# Patient Record
Sex: Female | Born: 1940 | Race: Black or African American | Hispanic: No | Smoking: Never smoker
Health system: Southern US, Community
[De-identification: ages and names within clinical notes are randomized; demographics above are authoritative.]

## PROBLEM LIST (undated history)

## (undated) DIAGNOSIS — Z923 Personal history of irradiation: Secondary | ICD-10-CM

## (undated) DIAGNOSIS — E079 Disorder of thyroid, unspecified: Secondary | ICD-10-CM

## (undated) DIAGNOSIS — E785 Hyperlipidemia, unspecified: Secondary | ICD-10-CM

## (undated) DIAGNOSIS — I10 Essential (primary) hypertension: Secondary | ICD-10-CM

## (undated) DIAGNOSIS — E119 Type 2 diabetes mellitus without complications: Secondary | ICD-10-CM

## (undated) HISTORY — DX: Essential (primary) hypertension: I10

## (undated) HISTORY — PX: OTHER SURGICAL HISTORY: SHX169

---

## 1997-11-18 ENCOUNTER — Ambulatory Visit (HOSPITAL_COMMUNITY): Admission: RE | Admit: 1997-11-18 | Discharge: 1997-11-18 | Payer: Self-pay | Admitting: Family Medicine

## 1997-12-20 ENCOUNTER — Other Ambulatory Visit: Admission: RE | Admit: 1997-12-20 | Discharge: 1997-12-20 | Payer: Self-pay | Admitting: Family Medicine

## 1998-05-18 ENCOUNTER — Emergency Department (HOSPITAL_COMMUNITY): Admission: EM | Admit: 1998-05-18 | Discharge: 1998-05-18 | Payer: Self-pay | Admitting: Endocrinology

## 1998-05-18 ENCOUNTER — Encounter: Payer: Self-pay | Admitting: Endocrinology

## 1998-09-04 ENCOUNTER — Emergency Department (HOSPITAL_COMMUNITY): Admission: EM | Admit: 1998-09-04 | Discharge: 1998-09-04 | Payer: Self-pay | Admitting: Emergency Medicine

## 1998-09-05 ENCOUNTER — Encounter: Payer: Self-pay | Admitting: Emergency Medicine

## 1998-12-23 ENCOUNTER — Other Ambulatory Visit: Admission: RE | Admit: 1998-12-23 | Discharge: 1998-12-23 | Payer: Self-pay | Admitting: Family Medicine

## 1999-04-19 ENCOUNTER — Emergency Department (HOSPITAL_COMMUNITY): Admission: EM | Admit: 1999-04-19 | Discharge: 1999-04-19 | Payer: Self-pay

## 1999-04-24 ENCOUNTER — Encounter: Payer: Self-pay | Admitting: Family Medicine

## 1999-04-24 ENCOUNTER — Encounter: Admission: RE | Admit: 1999-04-24 | Discharge: 1999-04-24 | Payer: Self-pay | Admitting: Family Medicine

## 1999-05-16 ENCOUNTER — Emergency Department (HOSPITAL_COMMUNITY): Admission: EM | Admit: 1999-05-16 | Discharge: 1999-05-16 | Payer: Self-pay | Admitting: Emergency Medicine

## 1999-05-16 ENCOUNTER — Encounter: Payer: Self-pay | Admitting: Emergency Medicine

## 1999-05-21 ENCOUNTER — Encounter: Payer: Self-pay | Admitting: Family Medicine

## 1999-05-21 ENCOUNTER — Encounter: Admission: RE | Admit: 1999-05-21 | Discharge: 1999-05-21 | Payer: Self-pay | Admitting: Family Medicine

## 1999-12-10 ENCOUNTER — Encounter: Payer: Self-pay | Admitting: Family Medicine

## 1999-12-10 ENCOUNTER — Encounter: Admission: RE | Admit: 1999-12-10 | Discharge: 1999-12-10 | Payer: Self-pay | Admitting: Family Medicine

## 1999-12-29 ENCOUNTER — Other Ambulatory Visit: Admission: RE | Admit: 1999-12-29 | Discharge: 1999-12-29 | Payer: Self-pay | Admitting: Family Medicine

## 2000-04-27 ENCOUNTER — Encounter: Payer: Self-pay | Admitting: Family Medicine

## 2000-04-27 ENCOUNTER — Encounter: Admission: RE | Admit: 2000-04-27 | Discharge: 2000-04-27 | Payer: Self-pay | Admitting: Family Medicine

## 2000-12-14 ENCOUNTER — Encounter: Payer: Self-pay | Admitting: Family Medicine

## 2000-12-14 ENCOUNTER — Encounter: Admission: RE | Admit: 2000-12-14 | Discharge: 2000-12-14 | Payer: Self-pay | Admitting: Family Medicine

## 2000-12-29 ENCOUNTER — Other Ambulatory Visit: Admission: RE | Admit: 2000-12-29 | Discharge: 2000-12-29 | Payer: Self-pay | Admitting: Family Medicine

## 2001-03-09 ENCOUNTER — Encounter: Payer: Self-pay | Admitting: Emergency Medicine

## 2001-03-09 ENCOUNTER — Emergency Department (HOSPITAL_COMMUNITY): Admission: EM | Admit: 2001-03-09 | Discharge: 2001-03-09 | Payer: Self-pay | Admitting: Emergency Medicine

## 2001-03-12 ENCOUNTER — Emergency Department (HOSPITAL_COMMUNITY): Admission: EM | Admit: 2001-03-12 | Discharge: 2001-03-12 | Payer: Self-pay | Admitting: Emergency Medicine

## 2001-03-12 ENCOUNTER — Encounter: Payer: Self-pay | Admitting: Emergency Medicine

## 2001-05-15 ENCOUNTER — Encounter: Admission: RE | Admit: 2001-05-15 | Discharge: 2001-05-15 | Payer: Self-pay | Admitting: Family Medicine

## 2001-05-15 ENCOUNTER — Encounter: Payer: Self-pay | Admitting: Family Medicine

## 2001-12-18 ENCOUNTER — Encounter: Admission: RE | Admit: 2001-12-18 | Discharge: 2001-12-18 | Payer: Self-pay | Admitting: Family Medicine

## 2001-12-18 ENCOUNTER — Encounter: Payer: Self-pay | Admitting: Family Medicine

## 2002-01-02 ENCOUNTER — Other Ambulatory Visit: Admission: RE | Admit: 2002-01-02 | Discharge: 2002-01-02 | Payer: Self-pay | Admitting: Family Medicine

## 2002-05-16 ENCOUNTER — Encounter: Admission: RE | Admit: 2002-05-16 | Discharge: 2002-05-16 | Payer: Self-pay | Admitting: Family Medicine

## 2002-05-16 ENCOUNTER — Encounter: Payer: Self-pay | Admitting: Family Medicine

## 2002-12-26 ENCOUNTER — Encounter: Payer: Self-pay | Admitting: Family Medicine

## 2002-12-26 ENCOUNTER — Encounter: Admission: RE | Admit: 2002-12-26 | Discharge: 2002-12-26 | Payer: Self-pay | Admitting: Family Medicine

## 2003-01-04 ENCOUNTER — Other Ambulatory Visit: Admission: RE | Admit: 2003-01-04 | Discharge: 2003-01-04 | Payer: Self-pay | Admitting: Family Medicine

## 2003-05-20 ENCOUNTER — Encounter: Admission: RE | Admit: 2003-05-20 | Discharge: 2003-05-20 | Payer: Self-pay | Admitting: Family Medicine

## 2004-01-03 ENCOUNTER — Encounter: Admission: RE | Admit: 2004-01-03 | Discharge: 2004-01-03 | Payer: Self-pay | Admitting: Family Medicine

## 2004-01-08 ENCOUNTER — Other Ambulatory Visit: Admission: RE | Admit: 2004-01-08 | Discharge: 2004-01-08 | Payer: Self-pay | Admitting: Family Medicine

## 2004-05-21 ENCOUNTER — Ambulatory Visit (HOSPITAL_COMMUNITY): Admission: RE | Admit: 2004-05-21 | Discharge: 2004-05-21 | Payer: Self-pay | Admitting: Family Medicine

## 2005-01-14 ENCOUNTER — Encounter: Admission: RE | Admit: 2005-01-14 | Discharge: 2005-01-14 | Payer: Self-pay | Admitting: Family Medicine

## 2005-01-19 ENCOUNTER — Other Ambulatory Visit: Admission: RE | Admit: 2005-01-19 | Discharge: 2005-01-19 | Payer: Self-pay | Admitting: Family Medicine

## 2005-06-08 ENCOUNTER — Encounter: Admission: RE | Admit: 2005-06-08 | Discharge: 2005-06-08 | Payer: Self-pay | Admitting: Family Medicine

## 2006-01-18 ENCOUNTER — Encounter: Admission: RE | Admit: 2006-01-18 | Discharge: 2006-01-18 | Payer: Self-pay | Admitting: Family Medicine

## 2006-01-20 ENCOUNTER — Other Ambulatory Visit: Admission: RE | Admit: 2006-01-20 | Discharge: 2006-01-20 | Payer: Self-pay | Admitting: Family Medicine

## 2006-06-09 ENCOUNTER — Encounter: Admission: RE | Admit: 2006-06-09 | Discharge: 2006-06-09 | Payer: Self-pay | Admitting: Family Medicine

## 2007-01-20 ENCOUNTER — Encounter: Admission: RE | Admit: 2007-01-20 | Discharge: 2007-01-20 | Payer: Self-pay | Admitting: Family Medicine

## 2007-06-21 ENCOUNTER — Encounter: Admission: RE | Admit: 2007-06-21 | Discharge: 2007-06-21 | Payer: Self-pay | Admitting: Family Medicine

## 2008-01-19 ENCOUNTER — Encounter: Admission: RE | Admit: 2008-01-19 | Discharge: 2008-01-19 | Payer: Self-pay | Admitting: Family Medicine

## 2008-01-24 ENCOUNTER — Other Ambulatory Visit: Admission: RE | Admit: 2008-01-24 | Discharge: 2008-01-24 | Payer: Self-pay | Admitting: Family Medicine

## 2008-06-21 ENCOUNTER — Encounter: Admission: RE | Admit: 2008-06-21 | Discharge: 2008-06-21 | Payer: Self-pay | Admitting: Internal Medicine

## 2009-06-23 ENCOUNTER — Encounter: Admission: RE | Admit: 2009-06-23 | Discharge: 2009-06-23 | Payer: Self-pay | Admitting: Family Medicine

## 2009-06-30 ENCOUNTER — Encounter: Admission: RE | Admit: 2009-06-30 | Discharge: 2009-06-30 | Payer: Self-pay | Admitting: *Deleted

## 2010-05-24 ENCOUNTER — Encounter: Payer: Self-pay | Admitting: Family Medicine

## 2010-05-28 ENCOUNTER — Other Ambulatory Visit: Payer: Self-pay | Admitting: Family Medicine

## 2010-05-28 DIAGNOSIS — Z1239 Encounter for other screening for malignant neoplasm of breast: Secondary | ICD-10-CM

## 2010-06-24 ENCOUNTER — Ambulatory Visit
Admission: RE | Admit: 2010-06-24 | Discharge: 2010-06-24 | Disposition: A | Discharge status: Home / Self Care | Payer: 59 | Source: Ambulatory Visit | Attending: Family Medicine | Admitting: Family Medicine

## 2010-06-24 DIAGNOSIS — Z1239 Encounter for other screening for malignant neoplasm of breast: Secondary | ICD-10-CM

## 2011-05-17 ENCOUNTER — Other Ambulatory Visit: Payer: Self-pay | Admitting: Family Medicine

## 2011-05-17 DIAGNOSIS — Z1231 Encounter for screening mammogram for malignant neoplasm of breast: Secondary | ICD-10-CM

## 2011-06-28 ENCOUNTER — Ambulatory Visit
Admission: RE | Admit: 2011-06-28 | Discharge: 2011-06-28 | Disposition: A | Discharge status: Home / Self Care | Payer: Medicare Other | Source: Ambulatory Visit | Attending: Family Medicine | Admitting: Family Medicine

## 2011-06-28 DIAGNOSIS — Z1231 Encounter for screening mammogram for malignant neoplasm of breast: Secondary | ICD-10-CM

## 2011-10-08 ENCOUNTER — Telehealth: Payer: Self-pay | Admitting: *Deleted

## 2011-10-08 NOTE — Telephone Encounter (Signed)
Left message for pt to return my call so I can schedule a genetics appt. 

## 2012-05-24 ENCOUNTER — Other Ambulatory Visit: Payer: Self-pay | Admitting: Family Medicine

## 2012-05-24 DIAGNOSIS — Z1231 Encounter for screening mammogram for malignant neoplasm of breast: Secondary | ICD-10-CM

## 2012-05-24 DIAGNOSIS — M8580 Other specified disorders of bone density and structure, unspecified site: Secondary | ICD-10-CM

## 2012-06-28 ENCOUNTER — Ambulatory Visit
Admission: RE | Admit: 2012-06-28 | Discharge: 2012-06-28 | Disposition: A | Discharge status: Home / Self Care | Payer: Medicare Other | Source: Ambulatory Visit | Attending: Family Medicine | Admitting: Family Medicine

## 2012-06-28 DIAGNOSIS — Z1231 Encounter for screening mammogram for malignant neoplasm of breast: Secondary | ICD-10-CM

## 2012-07-21 ENCOUNTER — Other Ambulatory Visit: Payer: Medicare Other

## 2012-07-21 ENCOUNTER — Ambulatory Visit: Payer: Medicare Other

## 2013-03-17 ENCOUNTER — Emergency Department (HOSPITAL_COMMUNITY)
Admission: EM | Admit: 2013-03-17 | Discharge: 2013-03-17 | Disposition: A | Discharge status: Home / Self Care | Payer: Medicare Other | Attending: Emergency Medicine | Admitting: Emergency Medicine

## 2013-03-17 ENCOUNTER — Emergency Department (HOSPITAL_COMMUNITY): Payer: Medicare Other

## 2013-03-17 ENCOUNTER — Encounter (HOSPITAL_COMMUNITY): Payer: Self-pay | Admitting: Emergency Medicine

## 2013-03-17 DIAGNOSIS — E119 Type 2 diabetes mellitus without complications: Secondary | ICD-10-CM | POA: Insufficient documentation

## 2013-03-17 DIAGNOSIS — Z79899 Other long term (current) drug therapy: Secondary | ICD-10-CM | POA: Insufficient documentation

## 2013-03-17 DIAGNOSIS — E079 Disorder of thyroid, unspecified: Secondary | ICD-10-CM | POA: Insufficient documentation

## 2013-03-17 DIAGNOSIS — J029 Acute pharyngitis, unspecified: Secondary | ICD-10-CM

## 2013-03-17 DIAGNOSIS — E785 Hyperlipidemia, unspecified: Secondary | ICD-10-CM | POA: Insufficient documentation

## 2013-03-17 HISTORY — DX: Type 2 diabetes mellitus without complications: E11.9

## 2013-03-17 HISTORY — DX: Hyperlipidemia, unspecified: E78.5

## 2013-03-17 HISTORY — DX: Disorder of thyroid, unspecified: E07.9

## 2013-03-17 MED ORDER — LIDOCAINE VISCOUS 2 % MT SOLN
15.0000 mL | Freq: Once | OROMUCOSAL | Status: AC
  Administered 2013-03-17: 15 mL via OROMUCOSAL
  Filled 2013-03-17: qty 15

## 2013-03-17 MED ORDER — ALUM & MAG HYDROXIDE-SIMETH 200-200-20 MG/5ML PO SUSP
30.0000 mL | Freq: Once | ORAL | Status: AC
  Administered 2013-03-17: 30 mL via ORAL
  Filled 2013-03-17: qty 30

## 2013-03-17 NOTE — ED Notes (Signed)
Pt transported to Xray. 

## 2013-03-17 NOTE — ED Notes (Addendum)
Pt states she was eating sausage last night and food went down the wrong way. Pt wants to be evaluated to make sure food is not still dislodged. Denies choking or anyway breathing difficulty. Pt voice is hoarse.

## 2013-03-17 NOTE — ED Notes (Signed)
PA gave water to pt, pt was able to swallow but spit after she swallowed the water. No gagging, she is able to maintain secretions, and airway intact.

## 2013-03-17 NOTE — ED Provider Notes (Signed)
CSN: [id number]     Arrival date & time 03/17/13  4098 History   First MD Initiated Contact with Patient 03/17/13 0636     Chief Complaint  Patient presents with  . Dysphagia   (Consider location/radiation/quality/duration/timing/severity/associated sxs/prior Treatment) Patient is a 72 y.o. female presenting with pharyngitis. The history is provided by the patient. No language interpreter was used.  Sore Throat This is a new problem. The current episode started today. Associated symptoms include a sore throat. Pertinent negatives include no abdominal pain, chest pain, chills, congestion, coughing, fever, nausea, rash, vomiting or weakness.   Pt is a 72 year old female who presents with c/o sore throat after eating chicken and a lot of hot sauce last night. She reports that she feels like her food "went down the wrong way" leaving her throat irritated. She denies fever, chills, nausea, vomiting or diarrhea. She reports that she slept fine last night. She reports that she has not had any recent illness lately and has been taking care of her elderly mother. She denies recent sick exposure or travel.   Past Medical History  Diagnosis Date  . Diabetes mellitus without complication   . Thyroid disease   . Hyperlipemia    History reviewed. No pertinent past surgical history. History reviewed. No pertinent family history. History  Substance Use Topics  . Smoking status: Never Smoker   . Smokeless tobacco: Not on file  . Alcohol Use: No   OB History   Grav Para Term Preterm Abortions TAB SAB Ect Mult Living                 Review of Systems  Constitutional: Negative for fever and chills.  HENT: Positive for sore throat. Negative for congestion.   Respiratory: Negative for cough, choking and shortness of breath.   Cardiovascular: Negative for chest pain.  Gastrointestinal: Negative for nausea, vomiting, abdominal pain, diarrhea and constipation.  Skin: Negative for rash.   Neurological: Negative for weakness.  All other systems reviewed and are negative.    Allergies  Review of patient's allergies indicates no known allergies.  Home Medications   Current Outpatient Rx  Name  Route  Sig  Dispense  Refill  . levothyroxine (SYNTHROID, LEVOTHROID) 50 MCG tablet   Oral   Take 50 mcg by mouth daily.         . simvastatin (ZOCOR) 20 MG tablet   Oral   Take 20 mg by mouth daily.          BP 146/68  Pulse 67  Temp(Src) 98.7 F (37.1 C) (Oral)  Resp 18  SpO2 100% Physical Exam  Nursing note and vitals reviewed. Constitutional: She is oriented to person, place, and time. She appears well-developed and well-nourished. No distress.  HENT:  Head: Normocephalic and atraumatic.  Mouth/Throat: Posterior oropharyngeal erythema present.  Neck: Normal range of motion. Neck supple. No JVD present. No tracheal deviation present. No thyromegaly present.  Cardiovascular: Normal rate, regular rhythm and normal heart sounds.   Pulmonary/Chest: Effort normal and breath sounds normal. No respiratory distress. She has no wheezes.  Abdominal: Soft. Bowel sounds are normal. She exhibits no distension. There is no tenderness.  Musculoskeletal: Normal range of motion.  Lymphadenopathy:    She has no cervical adenopathy.  Neurological: She is alert and oriented to person, place, and time.  Skin: Skin is warm and dry.  Psychiatric: Her behavior is normal. Judgment and thought content normal.    ED Course  Procedures (including  critical care time) Labs Review Labs Reviewed - No data to display Imaging Review No results found.  EKG Interpretation   None       MDM   1. Sore throat    Pt has a sore and irritated throat, and sensation that she swallowed something the wrong way after eating a lot of hot sauce last night. Throat appears mildly erythematous. Swallowing liquids here in ER without any difficulty. No edema or difficulty breathing at any time.  Soft tissue neck negative. Probable irritation from hot sauce.      Irish Elders, NP 03/17/13 0830

## 2013-03-19 NOTE — ED Provider Notes (Signed)
Medical screening examination/treatment/procedure(s) were performed by non-physician practitioner and as supervising physician I was immediately available for consultation/collaboration.  EKG Interpretation   None         Augusten Lipkin M Margreat Widener, MD 03/19/13 1500 

## 2013-04-24 ENCOUNTER — Other Ambulatory Visit: Payer: Self-pay

## 2013-04-24 DIAGNOSIS — Z1231 Encounter for screening mammogram for malignant neoplasm of breast: Secondary | ICD-10-CM

## 2013-05-25 ENCOUNTER — Ambulatory Visit
Admission: RE | Admit: 2013-05-25 | Discharge: 2013-05-25 | Disposition: A | Discharge status: Home / Self Care | Payer: Medicare Other | Source: Ambulatory Visit | Attending: Family Medicine | Admitting: Family Medicine

## 2013-05-25 DIAGNOSIS — M8580 Other specified disorders of bone density and structure, unspecified site: Secondary | ICD-10-CM

## 2013-06-29 ENCOUNTER — Ambulatory Visit: Payer: Medicare Other

## 2013-07-17 ENCOUNTER — Ambulatory Visit
Admission: RE | Admit: 2013-07-17 | Discharge: 2013-07-17 | Disposition: A | Discharge status: Home / Self Care | Payer: Medicare Other | Source: Ambulatory Visit

## 2013-07-17 DIAGNOSIS — Z1231 Encounter for screening mammogram for malignant neoplasm of breast: Secondary | ICD-10-CM

## 2013-10-23 ENCOUNTER — Encounter: Payer: Self-pay | Admitting: Internal Medicine

## 2013-10-24 ENCOUNTER — Encounter: Payer: Self-pay | Admitting: Internal Medicine

## 2013-12-13 ENCOUNTER — Ambulatory Visit (AMBULATORY_SURGERY_CENTER): Payer: Self-pay | Admitting: *Deleted

## 2013-12-13 VITALS — Ht 68.0 in | Wt 180.0 lb

## 2013-12-13 DIAGNOSIS — Z1211 Encounter for screening for malignant neoplasm of colon: Secondary | ICD-10-CM

## 2013-12-13 MED ORDER — MOVIPREP 100 G PO SOLR: 1.0000 | Freq: Once | ORAL | Status: DC

## 2013-12-13 NOTE — Progress Notes (Signed)
Denies allergies to eggs or soy products. Denies complications with sedation or anesthesia. Denies O2 use. Denies use of diet or weight loss medications.  Patient denies access to email or internet.

## 2013-12-25 ENCOUNTER — Telehealth: Payer: Self-pay | Admitting: Internal Medicine

## 2013-12-25 DIAGNOSIS — Z1211 Encounter for screening for malignant neoplasm of colon: Secondary | ICD-10-CM

## 2013-12-25 MED ORDER — MOVIPREP 100 G PO SOLR: 1.0000 | Freq: Once | ORAL | Status: DC

## 2013-12-25 NOTE — Telephone Encounter (Signed)
Pt mixed MoviPrep today and is scheduled for procedure [facility] 8/27.  Rx for MoviPrep sent to pt's pharmacy.  Reviewed prep instructions with pt.

## 2013-12-27 ENCOUNTER — Ambulatory Visit (AMBULATORY_SURGERY_CENTER): Payer: Medicare Other | Admitting: Internal Medicine

## 2013-12-27 ENCOUNTER — Encounter: Payer: Self-pay | Admitting: Internal Medicine

## 2013-12-27 VITALS — BP 131/67 | HR 61 | Temp 96.3°F | Resp 14 | Ht 68.0 in | Wt 180.0 lb

## 2013-12-27 DIAGNOSIS — Z1211 Encounter for screening for malignant neoplasm of colon: Secondary | ICD-10-CM

## 2013-12-27 MED ORDER — SODIUM CHLORIDE 0.9 % IV SOLN: 500.0000 mL | INTRAVENOUS | Status: DC

## 2013-12-27 NOTE — Patient Instructions (Signed)
YOU HAD AN ENDOSCOPIC PROCEDURE TODAY AT THE [facility]: Refer to the procedure report that was given to you for any specific questions about what was found during the examination.  If the procedure report does not answer your questions, please call your gastroenterologist to clarify.  If you requested that your care partner not be given the details of your procedure findings, then the procedure report has been included in a sealed envelope for you to review at your convenience later.  YOU SHOULD EXPECT: Some feelings of bloating in the abdomen. Passage of more gas than usual.  Walking can help get rid of the air that was put into your GI tract during the procedure and reduce the bloating. If you had a lower endoscopy (such as a colonoscopy or flexible sigmoidoscopy) you may notice spotting of blood in your stool or on the toilet paper. If you underwent a bowel prep for your procedure, then you may not have a normal bowel movement for a few days.  DIET: Your first meal following the procedure should be a light meal and then it is ok to progress to your normal diet.  A half-sandwich or bowl of soup is an example of a good first meal.  Heavy or fried foods are harder to digest and may make you feel nauseous or bloated.  Likewise meals heavy in dairy and vegetables can cause extra gas to form and this can also increase the bloating.  Drink plenty of fluids but you should avoid alcoholic beverages for 24 hours.  ACTIVITY: Your care partner should take you home directly after the procedure.  You should plan to take it easy, moving slowly for the rest of the day.  You can resume normal activity the day after the procedure however you should NOT DRIVE or use heavy machinery for 24 hours (because of the sedation medicines used during the test).    SYMPTOMS TO REPORT IMMEDIATELY: A gastroenterologist can be reached at any hour.  During normal business hours, 8:30 AM to 5:00 PM Monday through Friday,  call [phone].  After hours and on weekends, please call the GI answering service at [phone] who will take a message and have the physician on call contact you.   Following lower endoscopy (colonoscopy or flexible sigmoidoscopy):  Excessive amounts of blood in the stool  Significant tenderness or worsening of abdominal pains  Swelling of the abdomen that is new, acute  Fever of 100F or higher    FOLLOW UP: If any biopsies were taken you will be contacted by phone or by letter within the next 1-3 weeks.  Call your gastroenterologist if you have not heard about the biopsies in 3 weeks.  Our staff will call the home number listed on your records the next business day following your procedure to check on you and address any questions or concerns that you may have at that time regarding the information given to you following your procedure. This is a courtesy call and so if there is no answer at the home number and we have not heard from you through the emergency physician on call, we will assume that you have returned to your regular daily activities without incident.  SIGNATURES/CONFIDENTIALITY: You and/or your care partner have signed paperwork which will be entered into your electronic medical record.  These signatures attest to the fact that that the information above on your After Visit Summary has been reviewed and is understood.  Full responsibility of the confidentiality   this discharge information lies with you and/or your care-partner.  Diverticulosis Diverticulosis is the condition that develops when small pouches (diverticula) form in the wall of your colon. Your colon, or large intestine, is where water is absorbed and stool is formed. The pouches form when the inside layer of your colon pushes through weak spots in the outer layers of your colon. CAUSES  No one knows exactly what causes diverticulosis. RISK FACTORS  Being older than 50. Your risk for this condition  increases with age. Diverticulosis is rare in people younger than 40 years. By age 80, almost everyone has it.  Eating a low-fiber diet.  Being frequently constipated.  Being overweight.  Not getting enough exercise.  Smoking.  Taking over-the-counter pain medicines, like aspirin and ibuprofen. SYMPTOMS  Most people with diverticulosis do not have symptoms. DIAGNOSIS  Because diverticulosis often has no symptoms, health care providers often discover the condition during an exam for other colon problems. In many cases, a health care provider will diagnose diverticulosis while using a flexible scope to examine the colon (colonoscopy). TREATMENT  If you have never developed an infection related to diverticulosis, you may not need treatment. If you have had an infection before, treatment may include:  Eating more fruits, vegetables, and grains.  Taking a fiber supplement.  Taking a live bacteria supplement (probiotic).  Taking medicine to relax your colon. HOME CARE INSTRUCTIONS   Drink at least 6-8 glasses of water each day to prevent constipation.  Try not to strain when you have a bowel movement.  Keep all follow-up appointments. If you have had an infection before:  Increase the fiber in your diet as directed by your health care provider or dietitian.  Take a dietary fiber supplement if your health care provider approves.  Only take medicines as directed by your health care provider. SEEK MEDICAL CARE IF:   You have abdominal pain.  You have bloating.  You have cramps.  You have not gone to the bathroom in 3 days. SEEK IMMEDIATE MEDICAL CARE IF:   Your pain gets worse.  Yourbloating becomes very bad.  You have a fever or chills, and your symptoms suddenly get worse.  You begin vomiting.  You have bowel movements that are bloody or black. MAKE SURE YOU:  Understand these instructions.  Will watch your condition.  Will get help right away if you are  not doing well or get worse. Document Released: 01/15/2004 Document Revised: 04/24/2013 Document Reviewed: 03/14/2013 ExitCare Patient Information 2015 [employer]. This information is not intended to replace advice given to you by your health care provider. Make sure you discuss any questions you have with your health care provider.  High fiber diet information given.  

## 2013-12-27 NOTE — Op Note (Signed)
[facility]  [address] [place], [id number]   COLONOSCOPY PROCEDURE REPORT  PATIENT: Sabrina, Sawyer  MR#: [id number] BIRTHDATE: 19-Dec-1940 , 64  yrs. old GENDER: Female ENDOSCOPIST: Eustace Quail, MD REFERRED ID:[id number] Recall, M.D. PROCEDURE DATE:  12/27/2013 PROCEDURE:   Colonoscopy, screening First Screening Colonoscopy - Avg.  risk and is 50 yrs.  old or older - No.  Prior Negative Screening - Now for repeat screening. 10 or more years since last screening  History of Adenoma - Now for follow-up colonoscopy & has been > or = to 3 yrs.  N/A  Polyps Removed Today? No.  Recommend repeat exam, <10 yrs? No. ASA CLASS:   Class II INDICATIONS:average risk screening.   Negative index exam 2005 MEDICATIONS: MAC sedation, administered by CRNA and propofol (Diprivan) 240mg  IV  DESCRIPTION OF PROCEDURE:   After the risks benefits and alternatives of the procedure were thoroughly explained, informed consent was obtained.  A digital rectal exam revealed no abnormalities of the rectum.   The [id number]  endoscope was introduced through the anus and advanced to the cecum, which was identified by both the appendix and ileocecal valve. No adverse events experienced.   The quality of the prep was excellent, using MoviPrep  The instrument was then slowly withdrawn as the colon was fully examined.  COLON FINDINGS: Melanosis was found.   Moderate diverticulosis was noted throughout the entire examined colon.   The colon was otherwise normal.  There was no  inflammation, polyps or cancers unless previously stated.  Retroflexed views revealed internal hemorrhoids. The time to cecum=4 minutes 36 seconds.  Withdrawal time=12 minutes 37 seconds.  The scope was withdrawn and the procedure completed. COMPLICATIONS: There were no complications.  ENDOSCOPIC IMPRESSION: 1.   Melanosis was found 2.   Moderate diverticulosis was noted throughout the entire examined  colon 3.   The colon was otherwise normal  RECOMMENDATIONS: 1. Return to the care of your primary provider.  GI follow up as needed   eSigned:  Eustace Quail, MD 12/27/2013 9:15 AM   cc: The Patient    ; Billey Chang, MD

## 2013-12-27 NOTE — Progress Notes (Signed)
Report to PACU, RN, vss, BBS= Clear.  

## 2013-12-28 ENCOUNTER — Telehealth: Payer: Self-pay | Admitting: *Deleted

## 2013-12-28 NOTE — Telephone Encounter (Signed)
  Follow up Call-  Call back number 12/27/2013  Post procedure Call Back phone  # [phone]  Permission to leave phone message Yes     Patient questions:  Do you have a fever, pain , or abdominal swelling? No. Pain Score  0 *  Have you tolerated food without any problems? Yes.    Have you been able to return to your normal activities? Yes.    Do you have any questions about your discharge instructions: Diet   No. Medications  No. Follow up visit  No.  Do you have questions or concerns about your Care? No.  Actions: * If pain score is 4 or above: No action needed, pain <4.

## 2014-04-19 ENCOUNTER — Ambulatory Visit (INDEPENDENT_AMBULATORY_CARE_PROVIDER_SITE_OTHER): Payer: Medicare Other

## 2014-04-19 ENCOUNTER — Ambulatory Visit (INDEPENDENT_AMBULATORY_CARE_PROVIDER_SITE_OTHER): Payer: Medicare Other | Admitting: Emergency Medicine

## 2014-04-19 VITALS — BP 108/60 | HR 78 | Temp 97.9°F | Resp 18 | Ht 67.0 in | Wt 171.0 lb

## 2014-04-19 DIAGNOSIS — R42 Dizziness and giddiness: Secondary | ICD-10-CM

## 2014-04-19 DIAGNOSIS — R55 Syncope and collapse: Secondary | ICD-10-CM

## 2014-04-19 DIAGNOSIS — R112 Nausea with vomiting, unspecified: Secondary | ICD-10-CM

## 2014-04-19 DIAGNOSIS — M25561 Pain in right knee: Secondary | ICD-10-CM

## 2014-04-19 LAB — POCT CBC
Granulocyte percent: 54.7 %G (ref 37–80)
HCT, POC: 34.9 % — AB (ref 37.7–47.9)
Hemoglobin: 11.1 g/dL — AB (ref 12.2–16.2)
LYMPH, POC: 2.3 (ref 0.6–3.4)
MCH, POC: 26.8 pg — AB (ref 27–31.2)
MCHC: 31.8 g/dL (ref 31.8–35.4)
MCV: 84.1 fL (ref 80–97)
MID (CBC): 0.4 (ref 0–0.9)
MPV: 6.1 fL (ref 0–99.8)
POC Granulocyte: 3.2 (ref 2–6.9)
POC LYMPH %: 38.4 % (ref 10–50)
POC MID %: 6.9 % (ref 0–12)
Platelet Count, POC: 374 10*3/uL (ref 142–424)
RBC: 4.15 M/uL (ref 4.04–5.48)
RDW, POC: 16.3 %
WBC: 5.9 10*3/uL (ref 4.6–10.2)

## 2014-04-19 LAB — GLUCOSE, POCT (MANUAL RESULT ENTRY): POC Glucose: 117 mg/dl — AB (ref 70–99)

## 2014-04-19 NOTE — Progress Notes (Addendum)
Subjective:  This chart was scribed for Sabrina Jordan, MD by Dellis Filbert, ED Scribe at [facility].The patient was seen in exam room 05 and the patient's care was started at 10:42 AM.   Patient ID: Sabrina Sawyer, female    DOB: 1940-10-05, 73 y.o.   MRN: [id number] Chief Complaint  Patient presents with  . Knee Injury    rt injured sat.   . Knee Pain    HPI HPI Comments: Sabrina Sawyer is a 73 y.o. female who presents to [facility] complaining of right knee pain onset 6 days ago. Pt had an episode of syncope while in the bathroom. She first developed nausea then diaphoresis and finally a dizzy spell where she then passed out and hit her right knee on the door. Pt did pass out and is unaware how long she was out and did vomit. She notes having work done on her mouth and did not eat and drink like she was instructed. She also had a dizzy spell on Wednesday, pt believes this maybe because of low blood sugar, she ate candy to remedy this. Pt tried to exercise yesterday and had pain in her right knee. She denies CP, SOB.  Pt has a history of HTN, HLD and hyperthyroidism   There are no active problems to display for this patient.  Past Medical History  Diagnosis Date  . Diabetes mellitus without complication   . Thyroid disease   . Hyperlipemia   . Hypertension    Past Surgical History  Procedure Laterality Date  . Cyst removal face     No Known Allergies Prior to Admission medications   Medication Sig Start Date End Date Taking? Authorizing Provider  levothyroxine (SYNTHROID, LEVOTHROID) 50 MCG tablet Take 50 mcg by mouth daily. 02/13/13  Yes Historical Provider, MD  lisinopril (PRINIVIL,ZESTRIL) 5 MG tablet Take 5 mg by mouth. 10/23/13 10/23/14 Yes Historical Provider, MD  simvastatin (ZOCOR) 20 MG tablet Take 40 mg by mouth daily.  02/13/13  Yes Historical Provider, MD   History   Social History  . Marital Status: Divorced    Spouse Name: N/A    Number of  Children: N/A  . Years of Education: N/A   Occupational History  . Not on file.   Social History Main Topics  . Smoking status: Never Smoker   . Smokeless tobacco: Never Used  . Alcohol Use: No  . Drug Use: No  . Sexual Activity: Not on file   Other Topics Concern  . Not on file   Social History Narrative   Review of Systems  Constitutional: Positive for diaphoresis.  Respiratory: Negative for shortness of breath.   Cardiovascular: Negative for chest pain.  Gastrointestinal: Positive for nausea and vomiting.  Musculoskeletal: Positive for joint swelling.  Neurological: Positive for dizziness.      Objective:  BP 108/60 mmHg  Pulse 78  Temp(Src) 97.9 F (36.6 C) (Oral)  Resp 18  Ht 5\' 7"  (1.702 m)  Wt 171 lb (77.565 kg)  BMI 26.78 kg/m2  SpO2 100%  Physical Exam  Constitutional: She is oriented to person, place, and time. She appears well-developed and well-nourished. No distress.  HENT:  Head: Normocephalic and atraumatic.  Eyes: EOM are normal.  Neck: Normal range of motion.  Cardiovascular: Normal rate, regular rhythm and normal heart sounds.   Pulmonary/Chest: Effort normal and breath sounds normal.  Musculoskeletal:  Mild tenderness just below the patella with no sign swelling. No joint  laxity Negative drawer sign  Neurological: She is alert and oriented to person, place, and time.  Skin: Skin is warm and dry.  Psychiatric: She has a normal mood and affect. Her behavior is normal.  Nursing note and vitals reviewed.     Results for orders placed or performed in visit on 04/19/14  POCT CBC  Result Value Ref Range   WBC 5.9 4.6 - 10.2 K/uL   Lymph, poc 2.3 0.6 - 3.4   POC LYMPH PERCENT 38.4 10 - 50 %L   MID (cbc) 0.4 0 - 0.9   POC MID % 6.9 0 - 12 %M   POC Granulocyte 3.2 2 - 6.9   Granulocyte percent 54.7 37 - 80 %G   RBC 4.15 4.04 - 5.48 M/uL   Hemoglobin 11.1 (A) 12.2 - 16.2 g/dL   HCT, POC 34.9 (A) 37.7 - 47.9 %   MCV 84.1 80 - 97 fL   MCH,  POC 26.8 (A) 27 - 31.2 pg   MCHC 31.8 31.8 - 35.4 g/dL   RDW, POC 16.3 %   Platelet Count, POC 374 142 - 424 K/uL   MPV 6.1 0 - 99.8 fL  POCT glucose (manual entry)  Result Value Ref Range   POC Glucose 117 (A) 70 - 99 mg/dl   [facility] reading (PRIMARY) by  Dr. Everlene Farrier there is soft tissue calcification laterally. 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 There is mild irregularity of the lateral tibial plateau please comment EKG  NSR  Assessment & Plan:  Patient has tri-compartmental arthritic changes. We did advise ice and Tylenol for pain. Her hemoglobin is mildly low at 11.1 and she recently did have GI evaluation by colonoscopy in the summer.I personally performed the services described in this documentation, which was scribed in my presence. The recorded information has been reviewed and is accurate.

## 2014-04-19 NOTE — Patient Instructions (Signed)
Please follow-up with your regular doctor regarding your episode of syncope. Apply ice and take Tylenol for your knee pain.

## 2014-06-21 ENCOUNTER — Other Ambulatory Visit: Payer: Self-pay

## 2014-06-21 DIAGNOSIS — Z1231 Encounter for screening mammogram for malignant neoplasm of breast: Secondary | ICD-10-CM

## 2014-07-19 ENCOUNTER — Ambulatory Visit
Admission: RE | Admit: 2014-07-19 | Discharge: 2014-07-19 | Disposition: A | Discharge status: Home / Self Care | Payer: Medicare Other | Source: Ambulatory Visit

## 2014-07-19 DIAGNOSIS — Z1231 Encounter for screening mammogram for malignant neoplasm of breast: Secondary | ICD-10-CM

## 2015-04-23 ENCOUNTER — Other Ambulatory Visit: Payer: Self-pay | Admitting: Family Medicine

## 2015-04-23 DIAGNOSIS — Z1231 Encounter for screening mammogram for malignant neoplasm of breast: Secondary | ICD-10-CM

## 2015-04-23 DIAGNOSIS — E2839 Other primary ovarian failure: Secondary | ICD-10-CM

## 2015-07-21 ENCOUNTER — Ambulatory Visit
Admission: RE | Admit: 2015-07-21 | Discharge: 2015-07-21 | Disposition: A | Discharge status: Home / Self Care | Payer: Medicare Other | Source: Ambulatory Visit | Attending: Family Medicine | Admitting: Family Medicine

## 2015-07-21 DIAGNOSIS — E2839 Other primary ovarian failure: Secondary | ICD-10-CM

## 2015-07-21 DIAGNOSIS — Z1231 Encounter for screening mammogram for malignant neoplasm of breast: Secondary | ICD-10-CM

## 2016-06-22 ENCOUNTER — Other Ambulatory Visit: Payer: Self-pay | Admitting: Family Medicine

## 2016-06-22 DIAGNOSIS — Z1231 Encounter for screening mammogram for malignant neoplasm of breast: Secondary | ICD-10-CM

## 2016-07-21 ENCOUNTER — Ambulatory Visit
Admission: RE | Admit: 2016-07-21 | Discharge: 2016-07-21 | Disposition: A | Discharge status: Home / Self Care | Payer: Medicare Other | Source: Ambulatory Visit | Attending: Family Medicine | Admitting: Family Medicine

## 2016-07-21 ENCOUNTER — Ambulatory Visit: Payer: Medicare Other

## 2016-07-21 DIAGNOSIS — Z1231 Encounter for screening mammogram for malignant neoplasm of breast: Secondary | ICD-10-CM

## 2016-09-17 ENCOUNTER — Emergency Department (HOSPITAL_COMMUNITY): Payer: No Typology Code available for payment source

## 2016-09-17 ENCOUNTER — Encounter (HOSPITAL_COMMUNITY): Payer: Self-pay | Admitting: Nurse Practitioner

## 2016-09-17 ENCOUNTER — Emergency Department (HOSPITAL_COMMUNITY)
Admission: EM | Admit: 2016-09-17 | Discharge: 2016-09-17 | Disposition: A | Discharge status: Home / Self Care | Payer: No Typology Code available for payment source | Attending: Emergency Medicine | Admitting: Emergency Medicine

## 2016-09-17 DIAGNOSIS — I10 Essential (primary) hypertension: Secondary | ICD-10-CM | POA: Diagnosis not present

## 2016-09-17 DIAGNOSIS — S161XXA Strain of muscle, fascia and tendon at neck level, initial encounter: Secondary | ICD-10-CM | POA: Diagnosis not present

## 2016-09-17 DIAGNOSIS — Y999 Unspecified external cause status: Secondary | ICD-10-CM | POA: Insufficient documentation

## 2016-09-17 DIAGNOSIS — Y92410 Unspecified street and highway as the place of occurrence of the external cause: Secondary | ICD-10-CM | POA: Insufficient documentation

## 2016-09-17 DIAGNOSIS — S46011A Strain of muscle(s) and tendon(s) of the rotator cuff of right shoulder, initial encounter: Secondary | ICD-10-CM | POA: Insufficient documentation

## 2016-09-17 DIAGNOSIS — Y939 Activity, unspecified: Secondary | ICD-10-CM | POA: Insufficient documentation

## 2016-09-17 DIAGNOSIS — S46911A Strain of unspecified muscle, fascia and tendon at shoulder and upper arm level, right arm, initial encounter: Secondary | ICD-10-CM

## 2016-09-17 DIAGNOSIS — S199XXA Unspecified injury of neck, initial encounter: Secondary | ICD-10-CM | POA: Diagnosis present

## 2016-09-17 MED ORDER — ACETAMINOPHEN 325 MG PO TABS
650.0000 mg | ORAL_TABLET | Freq: Once | ORAL | Status: AC
  Administered 2016-09-17: 650 mg via ORAL
  Filled 2016-09-17: qty 2

## 2016-09-17 MED ORDER — METHOCARBAMOL 500 MG PO TABS: 500.0000 mg | ORAL_TABLET | Freq: Every evening | ORAL | 0 refills | Status: AC | PRN
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
## 2016-09-17 NOTE — Discharge Instructions (Signed)
Take Tylenol as needed for pain Take muscle relaxer at bedtime to help you sleep. This medicine makes you drowsy so do not take before driving or work Use a heating pad for sore muscles - use for 20 minutes several times a day Return for worsening symptoms
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
## 2017-05-12 ENCOUNTER — Other Ambulatory Visit: Payer: Self-pay | Admitting: Family Medicine

## 2017-05-12 DIAGNOSIS — Z1231 Encounter for screening mammogram for malignant neoplasm of breast: Secondary | ICD-10-CM

## 2017-06-08 ENCOUNTER — Other Ambulatory Visit: Payer: Self-pay

## 2017-06-08 DIAGNOSIS — E2839 Other primary ovarian failure: Secondary | ICD-10-CM

## 2017-07-22 ENCOUNTER — Ambulatory Visit
Admission: RE | Admit: 2017-07-22 | Discharge: 2017-07-22 | Disposition: A | Discharge status: Home / Self Care | Payer: Medicare Other | Source: Ambulatory Visit | Attending: Family Medicine | Admitting: Family Medicine

## 2017-07-22 ENCOUNTER — Ambulatory Visit
Admission: RE | Admit: 2017-07-22 | Discharge: 2017-07-22 | Disposition: A | Discharge status: Home / Self Care | Payer: Medicare Other | Source: Ambulatory Visit | Attending: *Deleted | Admitting: *Deleted

## 2017-07-22 ENCOUNTER — Ambulatory Visit: Payer: Medicare Other

## 2017-07-22 DIAGNOSIS — Z1231 Encounter for screening mammogram for malignant neoplasm of breast: Secondary | ICD-10-CM

## 2017-07-22 DIAGNOSIS — E2839 Other primary ovarian failure: Secondary | ICD-10-CM

## 2017-09-21 ENCOUNTER — Emergency Department (HOSPITAL_COMMUNITY): Payer: No Typology Code available for payment source

## 2017-09-21 ENCOUNTER — Encounter (HOSPITAL_COMMUNITY): Payer: Self-pay | Admitting: Emergency Medicine

## 2017-09-21 ENCOUNTER — Emergency Department (HOSPITAL_COMMUNITY)
Admission: EM | Admit: 2017-09-21 | Discharge: 2017-09-21 | Disposition: A | Discharge status: Home / Self Care | Payer: No Typology Code available for payment source | Attending: Emergency Medicine | Admitting: Emergency Medicine

## 2017-09-21 ENCOUNTER — Other Ambulatory Visit: Payer: Self-pay

## 2017-09-21 DIAGNOSIS — M25512 Pain in left shoulder: Secondary | ICD-10-CM | POA: Diagnosis present

## 2017-09-21 DIAGNOSIS — Y939 Activity, unspecified: Secondary | ICD-10-CM | POA: Insufficient documentation

## 2017-09-21 DIAGNOSIS — E039 Hypothyroidism, unspecified: Secondary | ICD-10-CM | POA: Insufficient documentation

## 2017-09-21 DIAGNOSIS — I10 Essential (primary) hypertension: Secondary | ICD-10-CM | POA: Diagnosis not present

## 2017-09-21 DIAGNOSIS — Y92410 Unspecified street and highway as the place of occurrence of the external cause: Secondary | ICD-10-CM | POA: Insufficient documentation

## 2017-09-21 DIAGNOSIS — Y999 Unspecified external cause status: Secondary | ICD-10-CM | POA: Diagnosis not present

## 2017-09-21 MED ORDER — IBUPROFEN 200 MG PO TABS
600.0000 mg | ORAL_TABLET | Freq: Once | ORAL | Status: AC
  Administered 2017-09-21: 600 mg via ORAL
  Filled 2017-09-21: qty 3

## 2017-09-21 NOTE — ED Notes (Signed)
Patient transported to X-ray 

## 2017-09-21 NOTE — ED Notes (Signed)
ED Provider at bedside. 
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
## 2017-09-21 NOTE — Discharge Instructions (Addendum)
Please return for any problem. Follow up with your regular physician as instructed. Take Ibuprofen as instructed for pain (600mg  by mouth, every 8 hours for pain).

## 2017-09-21 NOTE — ED Notes (Signed)
WARM COMPRESS GIVEN PER PT'S REQUEST
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
## 2018-01-26 ENCOUNTER — Other Ambulatory Visit: Payer: Self-pay

## 2018-01-26 ENCOUNTER — Encounter (HOSPITAL_COMMUNITY): Payer: Self-pay | Admitting: Emergency Medicine

## 2018-01-26 ENCOUNTER — Emergency Department (HOSPITAL_COMMUNITY): Payer: No Typology Code available for payment source

## 2018-01-26 ENCOUNTER — Emergency Department (HOSPITAL_COMMUNITY)
Admission: EM | Admit: 2018-01-26 | Discharge: 2018-01-26 | Disposition: A | Discharge status: Home / Self Care | Payer: No Typology Code available for payment source | Attending: Emergency Medicine | Admitting: Emergency Medicine

## 2018-01-26 DIAGNOSIS — R079 Chest pain, unspecified: Secondary | ICD-10-CM | POA: Insufficient documentation

## 2018-01-26 DIAGNOSIS — M542 Cervicalgia: Secondary | ICD-10-CM | POA: Insufficient documentation

## 2018-01-26 DIAGNOSIS — I10 Essential (primary) hypertension: Secondary | ICD-10-CM | POA: Insufficient documentation

## 2018-01-26 DIAGNOSIS — M25562 Pain in left knee: Secondary | ICD-10-CM | POA: Diagnosis present

## 2018-01-26 DIAGNOSIS — E119 Type 2 diabetes mellitus without complications: Secondary | ICD-10-CM | POA: Insufficient documentation

## 2018-01-26 DIAGNOSIS — Z79899 Other long term (current) drug therapy: Secondary | ICD-10-CM | POA: Insufficient documentation

## 2018-01-26 DIAGNOSIS — M25512 Pain in left shoulder: Secondary | ICD-10-CM | POA: Insufficient documentation

## 2018-01-26 NOTE — Discharge Instructions (Signed)
Make sure you are taking Tylenol regularly.  2 extra strength Tylenol every 6 hours.  Heating pad may also work.  Take it easy and do not do any strenuous work over the next few days.
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
## 2018-02-07 ENCOUNTER — Ambulatory Visit: Payer: No Typology Code available for payment source | Attending: Family Medicine | Admitting: Physical Therapy

## 2018-02-07 ENCOUNTER — Other Ambulatory Visit: Payer: Self-pay

## 2018-02-07 ENCOUNTER — Ambulatory Visit: Payer: Medicare Other

## 2018-02-07 DIAGNOSIS — M25512 Pain in left shoulder: Secondary | ICD-10-CM | POA: Diagnosis present

## 2018-02-07 DIAGNOSIS — M6281 Muscle weakness (generalized): Secondary | ICD-10-CM | POA: Diagnosis present

## 2018-02-07 DIAGNOSIS — M542 Cervicalgia: Secondary | ICD-10-CM

## 2018-02-07 NOTE — Patient Instructions (Signed)
Access Code: [email or web address]  URL: [email or web address]  Date: 02/07/2018  Prepared by: Lovett Calender   Exercises  Seated Cervical Rotation AROM - 10 reps - 3 sets - 1x daily - 7x weekly  Seated Cervical Sidebending Stretch - 10 reps - 1 sets - 1x daily - 7x weekly  Seated Scapular Retraction - 10 reps - 1 sets - 5 sec hold - 3x daily - 7x weekly
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
## 2018-02-15 ENCOUNTER — Encounter: Payer: Self-pay | Admitting: Physical Therapy

## 2018-02-15 ENCOUNTER — Ambulatory Visit: Payer: No Typology Code available for payment source | Admitting: Physical Therapy

## 2018-02-15 DIAGNOSIS — M542 Cervicalgia: Secondary | ICD-10-CM | POA: Diagnosis not present

## 2018-02-15 DIAGNOSIS — M25512 Pain in left shoulder: Secondary | ICD-10-CM

## 2018-02-15 DIAGNOSIS — M6281 Muscle weakness (generalized): Secondary | ICD-10-CM

## 2018-02-15 NOTE — Patient Instructions (Signed)
Access Code: [email or web address]  URL: [email or web address]  Date: 02/15/2018  Prepared by: Lovett Calender   Exercises  Seated Cervical Rotation AROM - 10 reps - 1 sets - 3x daily - 7x weekly  Seated Cervical Sidebending Stretch - 3 reps - 1 sets - 3x daily - 7x weekly  Seated Scapular Retraction - 10 reps - 1 sets - 5 sec hold - 3x daily - 7x weekly  Supine Chin Tuck - 10 reps - 1 sets - 5 sec hold - 3x daily - 7x weekly
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
## 2018-02-17 ENCOUNTER — Ambulatory Visit: Payer: No Typology Code available for payment source | Admitting: Physical Therapy

## 2018-02-17 ENCOUNTER — Encounter: Payer: Self-pay | Admitting: Physical Therapy

## 2018-02-17 DIAGNOSIS — M6281 Muscle weakness (generalized): Secondary | ICD-10-CM

## 2018-02-17 DIAGNOSIS — M542 Cervicalgia: Secondary | ICD-10-CM

## 2018-02-17 DIAGNOSIS — M25512 Pain in left shoulder: Secondary | ICD-10-CM
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
## 2018-02-17 NOTE — Patient Instructions (Signed)
Access Code: [email or web address]  URL: [email or web address]  Date: 02/17/2018  Prepared by: Venetia Night Murrell Dome   Exercises  Seated Cervical Rotation AROM - 10 reps - 1 sets - 3x daily - 7x weekly  Seated Cervical Sidebending Stretch - 3 reps - 1 sets - 3x daily - 7x weekly  Seated Scapular Retraction - 10 reps - 1 sets - 5 sec hold - 3x daily - 7x weekly  Supine Chin Tuck - 10 reps - 1 sets - 5 sec hold - 3x daily - 7x weekly  Seated Isometric Cervical Rotation - 5 reps - 1 sets - 10 hold - 1x daily - 7x weekly  Seated Isometric Cervical Sidebending - 5 reps - 1 sets - 10 hold - 1x daily - 7x weekly  Seated Isometric Cervical Extension - 5 reps - 1 sets - 10 hold - 1x daily - 7x weekly  Seated Shoulder Rolls - 10 reps - 3 sets - 3 hold - 1x daily - 7x weekly

## 2018-02-20 ENCOUNTER — Ambulatory Visit: Payer: No Typology Code available for payment source | Admitting: Physical Therapy

## 2018-02-20 ENCOUNTER — Encounter: Payer: Self-pay | Admitting: Physical Therapy

## 2018-02-20 DIAGNOSIS — M6281 Muscle weakness (generalized): Secondary | ICD-10-CM

## 2018-02-20 DIAGNOSIS — M25512 Pain in left shoulder: Secondary | ICD-10-CM

## 2018-02-20 DIAGNOSIS — M542 Cervicalgia: Secondary | ICD-10-CM | POA: Diagnosis not present

## 2018-02-20 NOTE — Patient Instructions (Signed)
Access Code: [email or web address]  URL: [email or web address]  Date: 02/20/2018  Prepared by: Venetia Night Beuhring   Exercises  Seated Cervical Rotation AROM - 10 reps - 1 sets - 3x daily - 7x weekly  Seated Cervical Sidebending Stretch - 3 reps - 1 sets - 3x daily - 7x weekly  Seated Scapular Retraction - 10 reps - 1 sets - 5 sec hold - 3x daily - 7x weekly  Supine Chin Tuck - 10 reps - 1 sets - 5 sec hold - 3x daily - 7x weekly  Seated Isometric Cervical Rotation - 5 reps - 1 sets - 10 hold - 1x daily - 7x weekly  Seated Isometric Cervical Sidebending - 5 reps - 1 sets - 10 hold - 1x daily - 7x weekly  Seated Isometric Cervical Extension - 5 reps - 1 sets - 10 hold - 1x daily - 7x weekly  Seated Shoulder Rolls - 10 reps - 3 sets - 3 hold - 1x daily - 7x weekly  Supine Shoulder Flexion AAROM - 10 reps - 3 sets - 1x daily - 7x weekly  Supine Shoulder Horizontal Abduction with Resistance - 10 reps - 3 sets - 1x daily - 7x weekly
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
## 2018-02-22 ENCOUNTER — Ambulatory Visit: Payer: No Typology Code available for payment source | Admitting: Physical Therapy

## 2018-02-22 ENCOUNTER — Encounter: Payer: Self-pay | Admitting: Physical Therapy

## 2018-02-22 DIAGNOSIS — M6281 Muscle weakness (generalized): Secondary | ICD-10-CM

## 2018-02-22 DIAGNOSIS — M542 Cervicalgia: Secondary | ICD-10-CM

## 2018-02-22 DIAGNOSIS — M25512 Pain in left shoulder: Secondary | ICD-10-CM

## 2018-02-22 NOTE — Therapy (Signed)
[facility] [address] [place], [place], [address] Phone: [phone]   Fax:  [phone]  Physical Therapy Treatment  Patient Details  Name: Sabrina Sawyer MRN: [id number] Date of Birth: Mar 18, 1941 Referring Provider (PT): Bernerd Limbo, MD   Encounter Date: 02/22/2018  PT End of Session - 02/22/18 0850    Visit Number  5    Date for PT Re-Evaluation  04/04/18    Authorization Type  medicare    PT Start Time  0848    PT Stop Time  0928    PT Time Calculation (min)  40 min    Activity Tolerance  Patient tolerated treatment well    Behavior During Therapy  [facility] for tasks assessed/performed       Past Medical History:  Diagnosis Date  . Diabetes mellitus without complication ([place])   . Hyperlipemia   . Hypertension   . Thyroid disease     Past Surgical History:  Procedure Laterality Date  . cyst removal face      There were no vitals filed for this visit.  Subjective Assessment - 02/22/18 0851    Subjective  pt reports her Rt arm felt bad last night, "if I could have taken it off I would".      Currently in Pain?  Yes    Pain Score  8     Pain Location  Arm    Pain Orientation  Right    Pain Descriptors / Indicators  Sharp;Cramping    Pain Type  Acute pain    Pain Radiating Towards  lateral Rt arm from mid upper arm down to wrist; cramping up into the head    Pain Frequency  Constant    Aggravating Factors   lifting something overhead    Multiple Pain Sites  No                       [facility] Adult PT Treatment/Exercise - 02/22/18 0001      Exercises   Exercises  Neck;Shoulder      Neck Exercises: Supine   Neck Retraction  10 reps;5 secs    Capital Flexion  15 reps    Shoulder Flexion  Both    Shoulder Flexion Limitations  A/AROM with dowel       Shoulder Exercises: Supine   Horizontal ABduction  Strengthening;Both;15 reps    Theraband Level (Shoulder Horizontal ABduction)   Level 1 (Yellow)    Diagonals  Strengthening;Right;Left;15 reps;Theraband    Theraband Level (Shoulder Diagonals)  Level 1 (Yellow)      Shoulder Exercises: Pulleys   Flexion  2 minutes    Scaption  2 minutes      Manual Therapy   Myofascial Release  shoulder girdle, occipital stretch, SCM bilateral Lt>Rt; cranial fascial release to frontal, parietal and temporal               PT Short Term Goals - 02/22/18 0851      PT SHORT TERM GOAL #2   Title  ind with initial HEP    Status  Achieved        PT Long Term Goals - 02/08/18 0743      PT LONG TERM GOAL #1   Title  pt will be ind with advanced HEP    Time  8    Period  Weeks    Status  New    Target Date  04/04/18  PT LONG TERM GOAL #2   Title  FOTO < or = to 39% limited    Time  8    Period  Weeks    Status  New    Target Date  04/04/18      PT LONG TERM GOAL #3   Title  Pt will have AROM bilateral shoulder flexion fo at least 140 deg for ability to reach into cabinets overhead.    Time  8    Period  Weeks    Status  New    Target Date  04/04/18      PT LONG TERM GOAL #4   Title  Pt will be able to perform cervical rotaion of at least 50 degrees bilaterally for looking while driving    Time  8    Period  Weeks    Status  New    Target Date  04/04/18            Plan - 02/22/18 1352    Clinical Impression Statement  Pt had decreased pain from 8/10 to 6/10 after myofacial release today.  Pt had improved SCM muscle elasticity.  Pt was able to perform SCM stretch with verbal and tactile cues.  Pt is doing well with exercises at home.  She will continue to benefit from skilled PT to progress posutral strength and ROM.    PT Treatment/Interventions  ADLs/Self Care Home Management;Biofeedback;Cryotherapy;Electrical Stimulation;Iontophoresis 4mg /ml Dexamethasone;Moist Heat;Traction;Balance training;Therapeutic exercise;Therapeutic activities;Neuromuscular re-education;Gait training;Patient/family  education;Manual techniques;Passive range of motion;Dry needling;Taping    PT Next Visit Plan  manual and modalities as needed for pain, posutre, gentle ROM shoulders and neck, gentle cervical stability/strength    PT Home Exercise Plan  Access Code: [email or web address]     Recommended Other Services  eval 97/9/48; cert signed    Consulted and Agree with Plan of Care  Patient       Patient will benefit from skilled therapeutic intervention in order to improve the following deficits and impairments:  Increased fascial restricitons, Pain, Postural dysfunction, Increased muscle spasms, Decreased range of motion, Decreased strength, Impaired UE functional use  Visit Diagnosis: Cervicalgia  Muscle weakness (generalized)  Acute pain of left shoulder     Problem List There are no active problems to display for this patient.   Zannie Cove, PT 02/22/2018, 2:02 PM  [facility] [address], [place], [place], [address] Phone: [phone]   Fax:  [phone]  Name: Sabrina Sawyer MRN: [id number] Date of Birth: 04-Apr-1941

## 2018-02-28 ENCOUNTER — Ambulatory Visit: Payer: No Typology Code available for payment source | Admitting: Physical Therapy

## 2018-02-28 ENCOUNTER — Encounter: Payer: Self-pay | Admitting: Physical Therapy

## 2018-02-28 DIAGNOSIS — M25512 Pain in left shoulder: Secondary | ICD-10-CM

## 2018-02-28 DIAGNOSIS — M6281 Muscle weakness (generalized): Secondary | ICD-10-CM

## 2018-02-28 DIAGNOSIS — M542 Cervicalgia: Secondary | ICD-10-CM
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
## 2018-03-03 ENCOUNTER — Encounter: Payer: Self-pay | Admitting: Physical Therapy

## 2018-03-03 ENCOUNTER — Ambulatory Visit: Payer: No Typology Code available for payment source | Attending: Family Medicine | Admitting: Physical Therapy

## 2018-03-03 DIAGNOSIS — M25512 Pain in left shoulder: Secondary | ICD-10-CM | POA: Insufficient documentation

## 2018-03-03 DIAGNOSIS — M542 Cervicalgia: Secondary | ICD-10-CM | POA: Diagnosis present

## 2018-03-03 DIAGNOSIS — M6281 Muscle weakness (generalized): Secondary | ICD-10-CM | POA: Diagnosis present

## 2018-03-03 NOTE — Therapy (Signed)
[facility] [address], [place], [place], [address] Phone: [phone]   Fax:  [phone]  Physical Therapy Treatment  Patient Details  Name: Sabrina Sawyer MRN: [id number] Date of Birth: 06/27/1940 Referring Provider (PT): Bernerd Limbo, MD   Encounter Date: 03/03/2018  PT End of Session - 03/03/18 0751    Visit Number  7    Date for PT Re-Evaluation  04/04/18    Authorization Type  medicare    PT Start Time  0754    PT Stop Time  0859    PT Time Calculation (min)  65 min    Activity Tolerance  Patient tolerated treatment well    Behavior During Therapy  [facility] for tasks assessed/performed       Past Medical History:  Diagnosis Date  . Diabetes mellitus without complication ([place])   . Hyperlipemia   . Hypertension   . Thyroid disease     Past Surgical History:  Procedure Laterality Date  . cyst removal face      There were no vitals filed for this visit.  Subjective Assessment - 03/03/18 0757    Subjective  I am doing better and the traction helped last time.    Currently in Pain?  Yes    Pain Score  5     Pain Location  Arm    Pain Orientation  Right    Pain Descriptors / Indicators  Sore    Pain Type  Acute pain    Pain Onset  More than a month ago    Pain Frequency  Intermittent    Multiple Pain Sites  No                       [facility] Adult PT Treatment/Exercise - 03/03/18 0001      Exercises   Exercises  Neck;Shoulder      Neck Exercises: Standing   Neck Retraction  5 reps   5 back, 5 with cervical retraction   Other Standing Exercises  radial nerve glides      Neck Exercises: Supine   Shoulder Flexion  Right;20 reps    Shoulder Flexion Limitations  eccentric lowering      Shoulder Exercises: Supine   Horizontal ABduction  Strengthening;Both;15 reps;Theraband    Theraband Level (Shoulder Horizontal ABduction)  Level 2 (Red)    Diagonals  Strengthening;Both;15  reps;Theraband    Theraband Level (Shoulder Diagonals)  Level 2 (Red)      Shoulder Exercises: Standing   External Rotation  Strengthening;Both;20 reps;Theraband    Theraband Level (Shoulder External Rotation)  Level 1 (Yellow)    Extension  Strengthening;Both;20 reps;Theraband    Theraband Level (Shoulder Extension)  Level 1 (Yellow)    Row  Strengthening;Both;20 reps;Theraband    Theraband Level (Shoulder Row)  Level 3 (Green)      Shoulder Exercises: Pulleys   Flexion  2 minutes    Scaption  2 minutes      Traction   Type of Traction  Cervical    Min (lbs)  8    Max (lbs)  15    Hold Time  60    Rest Time  10    Time  15      Manual Therapy   Soft tissue mobilization  upper trap scalenes, cervical paraspinals on Rt    Myofascial Release  occipitals stretch, suboccipital release  PT Short Term Goals - 02/22/18 4650      PT SHORT TERM GOAL #2   Title  ind with initial HEP    Status  Achieved        PT Long Term Goals - 02/28/18 0755      PT LONG TERM GOAL #3   Title  Pt will have AROM bilateral shoulder flexion fo at least 140 deg for ability to reach into cabinets overhead.    Time  8    Period  Weeks    Status  On-going   Pt achieved 125 deg bil 02/28/18     PT LONG TERM GOAL #4   Title  Pt will be able to perform cervical rotaion of at least 50 degrees bilaterally for looking while driving    Time  8    Period  Weeks    Status  Achieved   Rt rot 60 deg, Lt Rot 50 deg 02/28/18           Plan - 03/03/18 0844    Clinical Impression Statement  Pt had good response from traction.  She was able to progress strength exercises for shoulder, scapular, and cervical mobility.  She needs verbal and tactile cues to reduce shoulder shrug during exercises today.  Pt will benefit from skilled PT strengthen shoulder stabillity and continue to work on posture and improved soft tissue length for greater ROM.    PT Treatment/Interventions  ADLs/Self  Care Home Management;Biofeedback;Cryotherapy;Electrical Stimulation;Iontophoresis 4mg /ml Dexamethasone;Moist Heat;Traction;Balance training;Therapeutic exercise;Therapeutic activities;Neuromuscular re-education;Gait training;Patient/family education;Manual techniques;Passive range of motion;Dry needling;Taping    PT Next Visit Plan  cervical traction, manual therapy to cervial and shoulder, cervical and shoulder ROM and strengthening    PT Home Exercise Plan  Access Code: [email or web address]     Consulted and Agree with Plan of Care  Patient       Patient will benefit from skilled therapeutic intervention in order to improve the following deficits and impairments:  Increased fascial restricitons, Pain, Postural dysfunction, Increased muscle spasms, Decreased range of motion, Decreased strength, Impaired UE functional use  Visit Diagnosis: Acute pain of left shoulder  Cervicalgia  Muscle weakness (generalized)     Problem List There are no active problems to display for this patient.   Zannie Cove, PT 03/03/2018, 8:49 AM  [facility] [address], [place], [place], [address] Phone: [phone]   Fax:  [phone]  Name: KATHEY SIMER MRN: [id number] Date of Birth: 09-30-1940

## 2018-03-06 ENCOUNTER — Encounter: Payer: Medicare Other | Admitting: Physical Therapy

## 2018-03-08 ENCOUNTER — Encounter: Payer: Self-pay | Admitting: Physical Therapy

## 2018-03-08 ENCOUNTER — Ambulatory Visit: Payer: No Typology Code available for payment source | Admitting: Physical Therapy

## 2018-03-08 DIAGNOSIS — M6281 Muscle weakness (generalized): Secondary | ICD-10-CM

## 2018-03-08 DIAGNOSIS — M25512 Pain in left shoulder: Secondary | ICD-10-CM

## 2018-03-08 DIAGNOSIS — M542 Cervicalgia: Secondary | ICD-10-CM

## 2018-03-08 NOTE — Patient Instructions (Signed)
Access Code: [email or web address]  URL: [email or web address]  Date: 03/08/2018  Prepared by: Venetia Night Shiana Rappleye   Exercises  Seated Cervical Rotation AROM - 10 reps - 1 sets - 3x daily - 7x weekly  Seated Cervical Sidebending Stretch - 3 reps - 1 sets - 3x daily - 7x weekly  Seated Scapular Retraction - 10 reps - 1 sets - 5 sec hold - 3x daily - 7x weekly  Supine Chin Tuck - 10 reps - 1 sets - 5 sec hold - 3x daily - 7x weekly  Seated Isometric Cervical Rotation - 5 reps - 1 sets - 10 hold - 1x daily - 7x weekly  Seated Isometric Cervical Sidebending - 5 reps - 1 sets - 10 hold - 1x daily - 7x weekly  Seated Isometric Cervical Extension - 5 reps - 1 sets - 10 hold - 1x daily - 7x weekly  Seated Shoulder Rolls - 10 reps - 3 sets - 3 hold - 1x daily - 7x weekly  Supine Shoulder Flexion AAROM - 10 reps - 3 sets - 1x daily - 7x weekly  Supine Shoulder Horizontal Abduction with Resistance - 10 reps - 3 sets - 1x daily - 7x weekly  Sternocleidomastoid Stretch - 5 reps - 1 sets - 10 sec hold - 1x daily - 7x weekly  Doorway Pec Stretch at 90 Degrees Abduction - 3 reps - 3 sets - 30 hold - 1x daily - 7x weekly  Standing Row with Anchored Resistance - 10 reps - 3 sets - 1x daily - 7x weekly
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
## 2018-03-10 ENCOUNTER — Encounter: Payer: Self-pay | Admitting: Physical Therapy

## 2018-03-10 ENCOUNTER — Ambulatory Visit: Payer: No Typology Code available for payment source | Admitting: Physical Therapy

## 2018-03-10 DIAGNOSIS — M6281 Muscle weakness (generalized): Secondary | ICD-10-CM

## 2018-03-10 DIAGNOSIS — M25512 Pain in left shoulder: Secondary | ICD-10-CM

## 2018-03-10 DIAGNOSIS — M542 Cervicalgia: Secondary | ICD-10-CM

## 2018-03-10 NOTE — Therapy (Signed)
[facility] [address] [place], [place], [address] Phone: [phone]   Fax:  [phone]  Physical Therapy Treatment  Patient Details  Name: Sabrina Sawyer MRN: [id number] Date of Birth: [birth year] Referring Provider (PT): Bernerd Limbo, MD   Encounter Date: 03/10/2018  PT End of Session - 03/10/18 0752    Visit Number  9    Date for PT Re-Evaluation  04/04/18    Authorization Type  medicare    PT Start Time  0756    PT Stop Time  0854    PT Time Calculation (min)  58 min    Activity Tolerance  Patient tolerated treatment well    Behavior During Therapy  [facility] for tasks assessed/performed       Past Medical History:  Diagnosis Date  . Diabetes mellitus without complication ([place])   . Hyperlipemia   . Hypertension   . Thyroid disease     Past Surgical History:  Procedure Laterality Date  . cyst removal face      There were no vitals filed for this visit.  Subjective Assessment - 03/10/18 0758    Subjective  I am feeling a little better every day.  I have a little at night.  I have been doing the doorway stretch and it feels good    Patient Stated Goals  less pain, get back to bowling    Currently in Pain?  Yes    Pain Score  3     Pain Location  Arm    Pain Orientation  Left    Pain Descriptors / Indicators  Sore    Pain Frequency  Intermittent    Aggravating Factors   reaching arm out    Multiple Pain Sites  No                       [facility] Adult PT Treatment/Exercise - 03/10/18 0001      Exercises   Exercises  Neck;Shoulder      Shoulder Exercises: Standing   External Rotation  Strengthening;Both;20 reps;Theraband    Theraband Level (Shoulder External Rotation)  Level 2 (Red)    External Rotation Limitations  PT cued Pt to avoid abduction with ER    Flexion  Strengthening;Right;Left;20 reps;Theraband   forward punch   Theraband Level (Shoulder Flexion)  Level 1 (Yellow)     Flexion Limitations  cues to not use upper trap;     Extension  Strengthening;Both;20 reps;Theraband    Theraband Level (Shoulder Extension)  Level 2 (Red)    Row  Strengthening;Both;20 reps;Theraband    Theraband Level (Shoulder Row)  Level 3 (Green)      Shoulder Exercises: Pulleys   Flexion  3 minutes      Traction   Type of Traction  Cervical    Min (lbs)  8    Max (lbs)  16    Hold Time  60    Rest Time  10    Time  15      Manual Therapy   Myofascial Release  occipitals stretch, suboccipital release               PT Short Term Goals - 02/22/18 0851      PT SHORT TERM GOAL #2   Title  ind with initial HEP    Status  Achieved        PT Long Term Goals - 02/28/18 0755      PT  LONG TERM GOAL #3   Title  Pt will have AROM bilateral shoulder flexion fo at least 140 deg for ability to reach into cabinets overhead.    Time  8    Period  Weeks    Status  On-going   Pt achieved 125 deg bil 02/28/18     PT LONG TERM GOAL #4   Title  Pt will be able to perform cervical rotaion of at least 50 degrees bilaterally for looking while driving    Time  8    Period  Weeks    Status  Achieved   Rt rot 60 deg, Lt Rot 50 deg 02/28/18           Plan - 03/10/18 0825    Clinical Impression Statement  Pt report she is feeling better every day.  She was able to increased resistance with exercises.  She still requires moderate amount of cues to not use upper trap to elevate shoulder when reaching overhead.  Pt continues to need skilled PT for improved strength and ROM, soft tissue release and traction for pain management.    PT Treatment/Interventions  ADLs/Self Care Home Management;Biofeedback;Cryotherapy;Electrical Stimulation;Iontophoresis 4mg /ml Dexamethasone;Moist Heat;Traction;Balance training;Therapeutic exercise;Therapeutic activities;Neuromuscular re-education;Gait training;Patient/family education;Manual techniques;Passive range of motion;Dry needling;Taping     PT Home Exercise Plan  Access Code: [email or web address]     Consulted and Agree with Plan of Care  Patient       Patient will benefit from skilled therapeutic intervention in order to improve the following deficits and impairments:  Increased fascial restricitons, Pain, Postural dysfunction, Increased muscle spasms, Decreased range of motion, Decreased strength, Impaired UE functional use  Visit Diagnosis: Acute pain of left shoulder  Cervicalgia  Muscle weakness (generalized)     Problem List There are no active problems to display for this patient.   Janey Genta Crosser,PT 03/10/2018, 8:46 AM   [facility] [address] [place], [place], [address] Phone: [phone]   Fax:  [phone]  Name: Sabrina Sawyer MRN: [id number] Date of Birth: [birth year]

## 2018-03-15 ENCOUNTER — Ambulatory Visit: Payer: No Typology Code available for payment source | Admitting: Physical Therapy

## 2018-03-15 ENCOUNTER — Encounter: Payer: Self-pay | Admitting: Physical Therapy

## 2018-03-15 DIAGNOSIS — M25512 Pain in left shoulder: Secondary | ICD-10-CM | POA: Diagnosis not present

## 2018-03-15 DIAGNOSIS — M6281 Muscle weakness (generalized): Secondary | ICD-10-CM

## 2018-03-15 DIAGNOSIS — M542 Cervicalgia: Secondary | ICD-10-CM
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
## 2018-03-17 ENCOUNTER — Ambulatory Visit: Payer: No Typology Code available for payment source | Admitting: Physical Therapy

## 2018-03-17 ENCOUNTER — Encounter: Payer: Self-pay | Admitting: Physical Therapy

## 2018-03-17 DIAGNOSIS — M25512 Pain in left shoulder: Secondary | ICD-10-CM

## 2018-03-17 DIAGNOSIS — M542 Cervicalgia: Secondary | ICD-10-CM

## 2018-03-17 DIAGNOSIS — M6281 Muscle weakness (generalized): Secondary | ICD-10-CM
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
## 2018-03-22 ENCOUNTER — Encounter: Payer: Self-pay | Admitting: Physical Therapy

## 2018-03-22 ENCOUNTER — Ambulatory Visit: Payer: No Typology Code available for payment source | Admitting: Physical Therapy

## 2018-03-22 DIAGNOSIS — M25512 Pain in left shoulder: Secondary | ICD-10-CM | POA: Diagnosis not present

## 2018-03-22 DIAGNOSIS — M542 Cervicalgia: Secondary | ICD-10-CM

## 2018-03-22 DIAGNOSIS — M6281 Muscle weakness (generalized): Secondary | ICD-10-CM
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
## 2018-03-24 ENCOUNTER — Encounter: Payer: Self-pay | Admitting: Physical Therapy

## 2018-03-24 ENCOUNTER — Ambulatory Visit: Payer: No Typology Code available for payment source | Admitting: Physical Therapy

## 2018-03-24 DIAGNOSIS — M6281 Muscle weakness (generalized): Secondary | ICD-10-CM

## 2018-03-24 DIAGNOSIS — M25512 Pain in left shoulder: Secondary | ICD-10-CM | POA: Diagnosis not present

## 2018-03-24 DIAGNOSIS — M542 Cervicalgia: Secondary | ICD-10-CM

## 2018-03-24 NOTE — Therapy (Signed)
[facility] [address], [place], [place], [address] Phone: [phone]   Fax:  [phone]  Physical Therapy Treatment  Patient Details  Name: Sabrina Sawyer MRN: [id number] Date of Birth: June 15, 1940 Referring Provider (PT): Bernerd Limbo, MD   Encounter Date: 03/24/2018  PT End of Session - 03/24/18 0847    Visit Number  13    Date for PT Re-Evaluation  04/04/18    Authorization Type  medicare    PT Start Time  0843    PT Stop Time  0928    PT Time Calculation (min)  45 min    Activity Tolerance  Patient tolerated treatment well    Behavior During Therapy  [facility] for tasks assessed/performed       Past Medical History:  Diagnosis Date  . Diabetes mellitus without complication ([place])   . Hyperlipemia   . Hypertension   . Thyroid disease     Past Surgical History:  Procedure Laterality Date  . cyst removal face      There were no vitals filed for this visit.  Subjective Assessment - 03/24/18 0924    Subjective  Pt reports arm is feeling better    Patient Stated Goals  less pain, get back to bowling    Currently in Pain?  Yes    Pain Score  3     Pain Location  Arm    Pain Orientation  Right    Pain Frequency  Intermittent    Aggravating Factors   reaching up    Multiple Pain Sites  No                       [facility] Adult PT Treatment/Exercise - 03/24/18 0001      Exercises   Exercises  Shoulder      Neck Exercises: Machines for Strengthening   UBE (Upper Arm Bike)  level 2 x 2min fwd, 3 bwd      Neck Exercises: Supine   Neck Retraction  10 reps;5 secs    Capital Flexion  15 reps;3 secs      Shoulder Exercises: Standing   External Rotation  Strengthening;Both;20 reps;Theraband    Theraband Level (Shoulder External Rotation)  Level 2 (Red)    Flexion  Strengthening;20 reps    Theraband Level (Shoulder Flexion)  Level 2 (Red)    Flexion Limitations  forward punch at shoulder  height    Extension  Strengthening;Both;20 reps;Theraband    Theraband Level (Shoulder Extension)  Level 3 (Green)   cues to prevent shoulder hiking   Theraband Level (Shoulder Row)  Level 4 (Blue)    Row Limitations  PT cued to avoid shoulder hiking    Other Standing Exercises  scap stabilization against the wall weighted red ball - circles 20x each way      Traction   Type of Traction  Cervical    Min (lbs)  8    Max (lbs)  15    Hold Time  60    Rest Time  10    Time  15               PT Short Term Goals - 03/15/18 0934      PT SHORT TERM GOAL #1   Title  Pt will have at least 100 deg of shoulder flexion bilaterally    Time  4    Period  Weeks    Status  Achieved  PT SHORT TERM GOAL #2   Title  ind with initial HEP    Time  4    Period  Weeks    Status  Achieved        PT Long Term Goals - 03/15/18 0936      PT LONG TERM GOAL #1   Title  pt will be ind with advanced HEP    Time  8    Period  Weeks    Status  On-going      PT LONG TERM GOAL #2   Title  FOTO < or = to 39% limited    Time  8    Period  Weeks    Status  Achieved   39% 03/15/18     PT LONG TERM GOAL #3   Title  Pt will have AROM bilateral shoulder flexion fo at least 140 deg for ability to reach into cabinets overhead.    Time  8    Period  Weeks    Status  On-going   120 Rt, 125 Lt     PT LONG TERM GOAL #4   Title  Pt will be able to perform cervical rotaion of at least 50 degrees bilaterally for looking while driving    Time  8    Period  Weeks    Status  Achieved   60 deg bil 11/13           Plan - 03/24/18 1610    Clinical Impression Statement  Pt continues to progress with increased resistence.  Pt is able to reach to 90 degress flexion and scaption with improved scapular mobilty when PT applies firm tactile cues.  Pt will benefit from skilled PT in order to return to greater function of overhead activities.    PT Treatment/Interventions  ADLs/Self Care Home  Management;Biofeedback;Cryotherapy;Electrical Stimulation;Iontophoresis 4mg /ml Dexamethasone;Moist Heat;Traction;Balance training;Therapeutic exercise;Therapeutic activities;Neuromuscular re-education;Gait training;Patient/family education;Manual techniques;Passive range of motion;Dry needling;Taping    PT Next Visit Plan  cervical traction, manual therapy to cervial and shoulder, cervical and shoulder ROM and strengthening, scapular stability    PT Home Exercise Plan  Access Code: [email or web address]     Consulted and Agree with Plan of Care  Patient       Patient will benefit from skilled therapeutic intervention in order to improve the following deficits and impairments:  Increased fascial restricitons, Pain, Postural dysfunction, Increased muscle spasms, Decreased range of motion, Decreased strength, Impaired UE functional use  Visit Diagnosis: Cervicalgia  Acute pain of left shoulder  Muscle weakness (generalized)     Problem List There are no active problems to display for this patient.   Zannie Cove, PT 03/24/2018, 9:25 AM  [facility] [address], [place], [place], [address] Phone: [phone]   Fax:  [phone]  Name: Sabrina Sawyer MRN: [id number] Date of Birth: 1940/07/19

## 2018-03-28 ENCOUNTER — Ambulatory Visit: Payer: No Typology Code available for payment source | Admitting: Physical Therapy

## 2018-03-28 ENCOUNTER — Encounter: Payer: Self-pay | Admitting: Physical Therapy

## 2018-03-28 DIAGNOSIS — M25512 Pain in left shoulder: Secondary | ICD-10-CM | POA: Diagnosis not present

## 2018-03-28 DIAGNOSIS — M542 Cervicalgia: Secondary | ICD-10-CM

## 2018-03-28 DIAGNOSIS — M6281 Muscle weakness (generalized): Secondary | ICD-10-CM
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
## 2018-04-04 ENCOUNTER — Ambulatory Visit: Payer: No Typology Code available for payment source | Attending: Family Medicine | Admitting: Physical Therapy

## 2018-04-04 ENCOUNTER — Encounter: Payer: Self-pay | Admitting: Physical Therapy

## 2018-04-04 DIAGNOSIS — M542 Cervicalgia: Secondary | ICD-10-CM | POA: Insufficient documentation

## 2018-04-04 DIAGNOSIS — M6281 Muscle weakness (generalized): Secondary | ICD-10-CM | POA: Insufficient documentation

## 2018-04-04 DIAGNOSIS — M25512 Pain in left shoulder: Secondary | ICD-10-CM | POA: Diagnosis present
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
## 2018-04-10 ENCOUNTER — Encounter: Payer: Medicare Other | Admitting: Physical Therapy

## 2018-06-14 ENCOUNTER — Other Ambulatory Visit: Payer: Self-pay | Admitting: Family Medicine

## 2018-06-14 DIAGNOSIS — Z1231 Encounter for screening mammogram for malignant neoplasm of breast: Secondary | ICD-10-CM

## 2018-07-25 ENCOUNTER — Ambulatory Visit: Payer: Medicare Other

## 2018-08-23 ENCOUNTER — Ambulatory Visit: Payer: Medicare Other

## 2018-10-09 ENCOUNTER — Other Ambulatory Visit: Payer: Self-pay

## 2018-10-09 ENCOUNTER — Ambulatory Visit
Admission: RE | Admit: 2018-10-09 | Discharge: 2018-10-09 | Disposition: A | Discharge status: Home / Self Care | Payer: Medicare Other | Source: Ambulatory Visit | Attending: Family Medicine | Admitting: Family Medicine

## 2018-10-09 DIAGNOSIS — Z1231 Encounter for screening mammogram for malignant neoplasm of breast: Secondary | ICD-10-CM

## 2019-06-25 ENCOUNTER — Ambulatory Visit: Payer: Medicare Other | Attending: Family

## 2019-06-25 DIAGNOSIS — Z23 Encounter for immunization: Secondary | ICD-10-CM

## 2019-06-25 NOTE — Progress Notes (Signed)
   Covid-19 Vaccination Clinic  Name:  Sabrina Sawyer    MRN: [id number] DOB: 1940-11-22  06/25/2019  [name] was observed post Covid-19 immunization for 15 minutes without incidence. She was provided with Vaccine Information Sheet and instruction to access the V-Safe system.   [name] was instructed to call 911 with any severe reactions post vaccine: Marland Kitchen Difficulty breathing  . Swelling of your face and throat  . A fast heartbeat  . A bad rash all over your body  . Dizziness and weakness    Immunizations Administered    Name Date Dose VIS Date Route   Moderna COVID-19 Vaccine 06/25/2019 12:54 PM 0.5 mL 04/03/2019 Intramuscular   Manufacturer: Moderna   Lot: [id number]   [place]:[id number]

## 2019-07-31 ENCOUNTER — Ambulatory Visit: Payer: Medicare Other | Attending: Internal Medicine

## 2019-07-31 DIAGNOSIS — Z23 Encounter for immunization: Secondary | ICD-10-CM

## 2019-07-31 NOTE — Progress Notes (Signed)
   Covid-19 Vaccination Clinic  Name:  Sabrina Sawyer    MRN: [id number] DOB: [birth year]  07/31/2019  [name] was observed post Covid-19 immunization for 15 minutes without incident. She was provided with Vaccine Information Sheet and instruction to access the V-Safe system.   [name] was instructed to call 911 with any severe reactions post vaccine: Marland Kitchen Difficulty breathing  . Swelling of face and throat  . A fast heartbeat  . A bad rash all over body  . Dizziness and weakness   Immunizations Administered    Name Date Dose VIS Date Route   Moderna COVID-19 Vaccine 07/31/2019 10:48 AM 0.5 mL 04/03/2019 Intramuscular   Manufacturer: Moderna   LotFP:[id number]   [place]:[id number]

## 2019-08-16 ENCOUNTER — Other Ambulatory Visit: Payer: Self-pay | Admitting: Family Medicine

## 2019-08-16 DIAGNOSIS — Z1231 Encounter for screening mammogram for malignant neoplasm of breast: Secondary | ICD-10-CM

## 2019-10-10 ENCOUNTER — Other Ambulatory Visit: Payer: Self-pay

## 2019-10-10 ENCOUNTER — Ambulatory Visit
Admission: RE | Admit: 2019-10-10 | Discharge: 2019-10-10 | Disposition: A | Discharge status: Home / Self Care | Payer: Medicare Other | Source: Ambulatory Visit | Attending: Family Medicine | Admitting: Family Medicine

## 2019-10-10 DIAGNOSIS — Z1231 Encounter for screening mammogram for malignant neoplasm of breast: Secondary | ICD-10-CM

## 2019-11-05 IMAGING — MG DIGITAL SCREENING BILATERAL MAMMOGRAM WITH TOMO AND CAD
8 series · 9 of 24 positions shown · non-contrast
Comparison: Previous exam(s).

CLINICAL DATA: Screening.

EXAM:
DIGITAL SCREENING BILATERAL MAMMOGRAM WITH TOMO AND CAD

[L CC synth-2D]
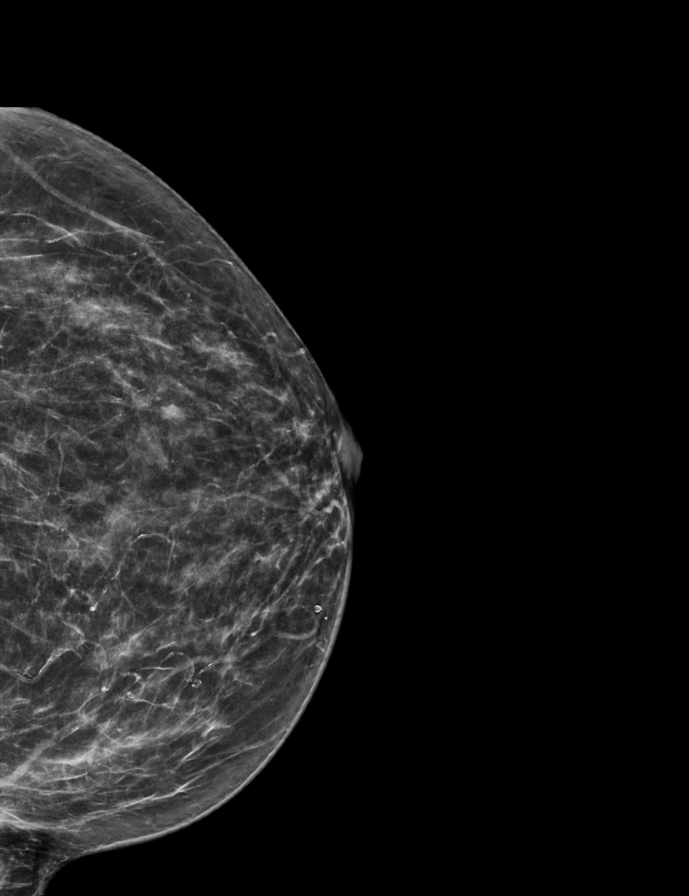

[L MLO synth-2D]
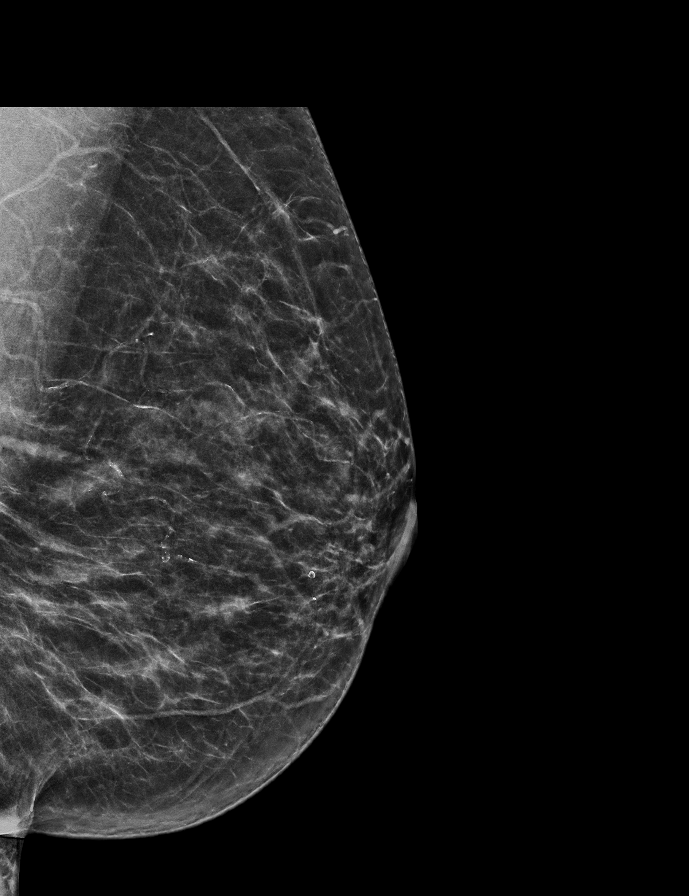

[R MLO synth-2D]
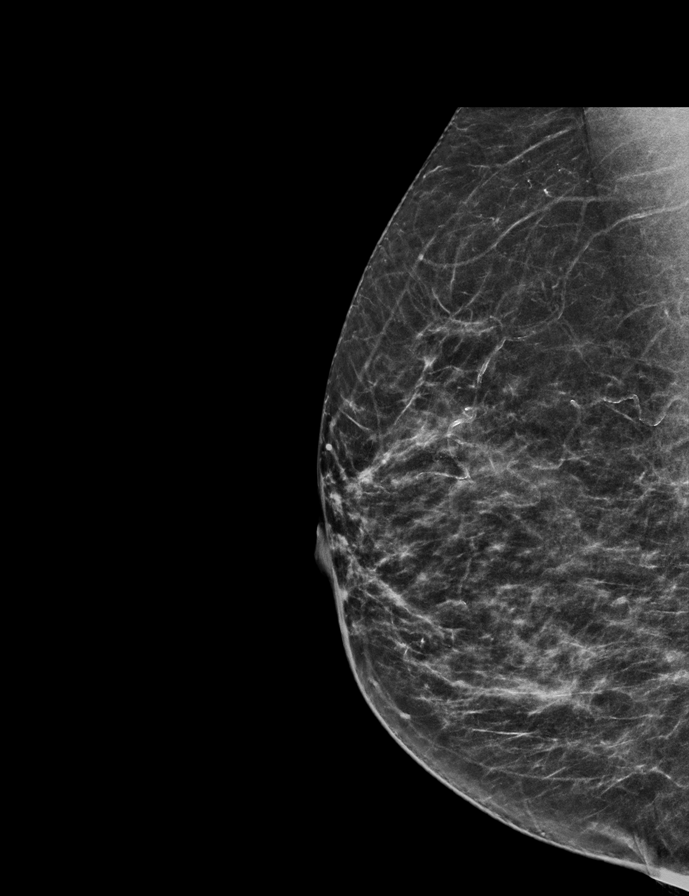

[R CC synth-2D]
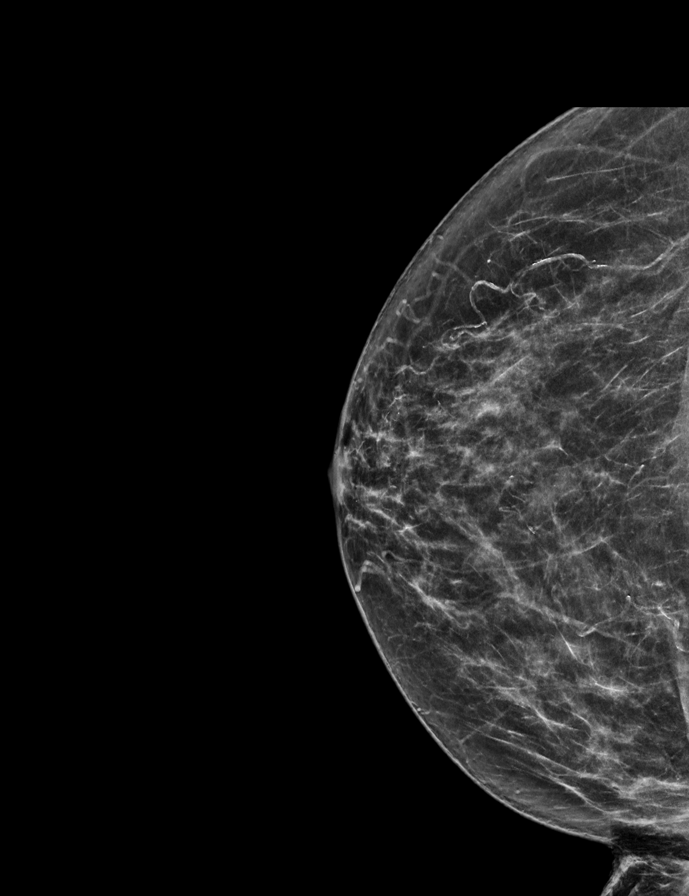

[L CC tomo · 2 of 60 frames shown]
[frame 20/60]
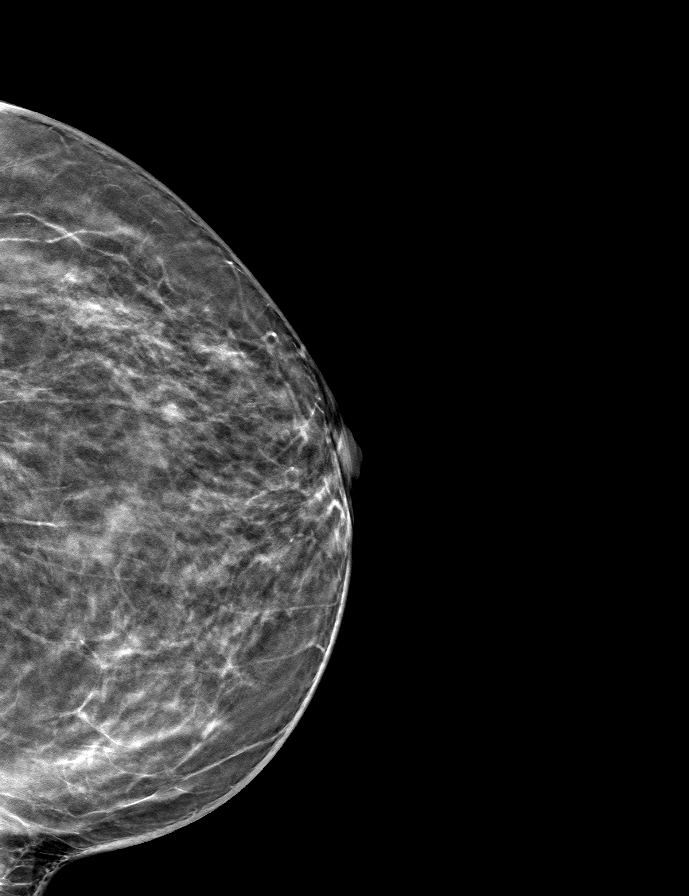
[frame 31/60]
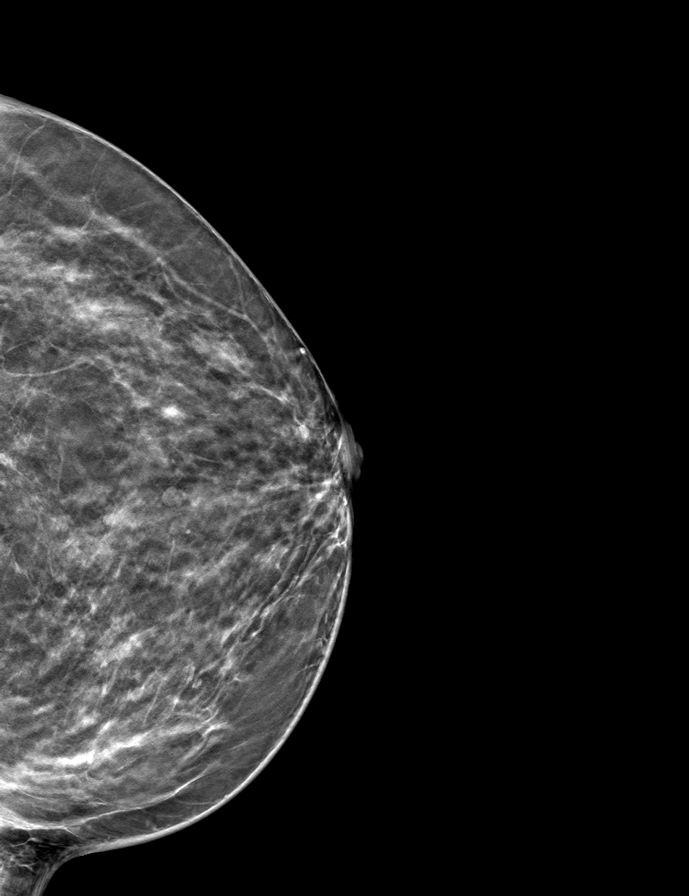

[L MLO tomo · tomo slice 31/62.0]
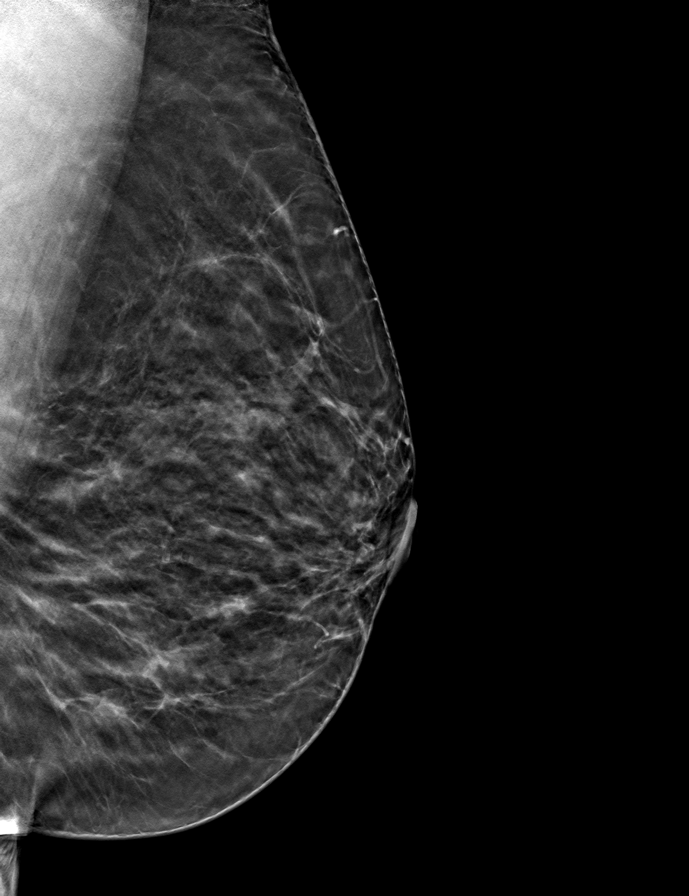

[R MLO tomo · tomo slice 30/59.0]
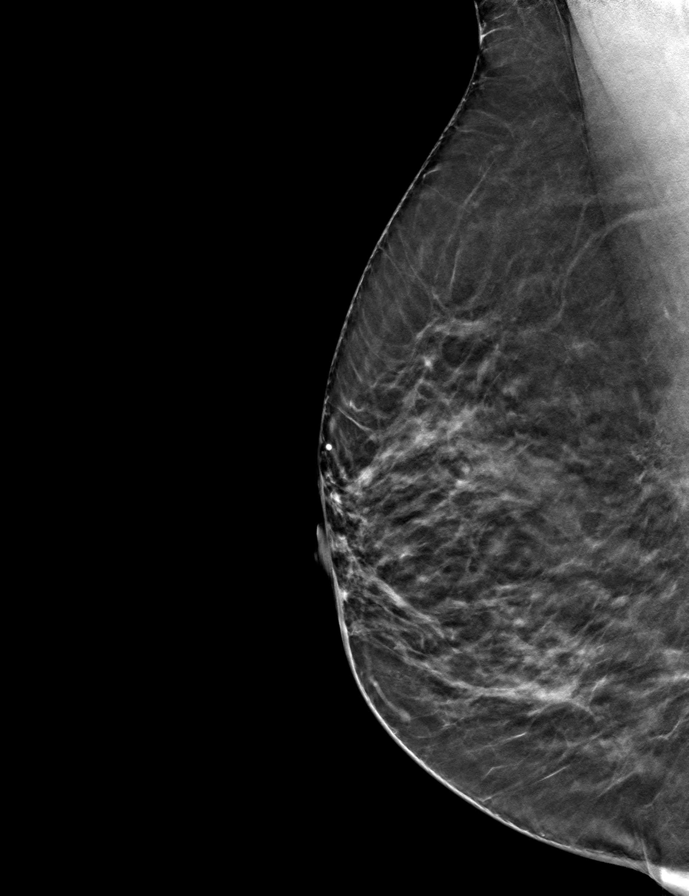

[R CC tomo · tomo slice 33/65.0]
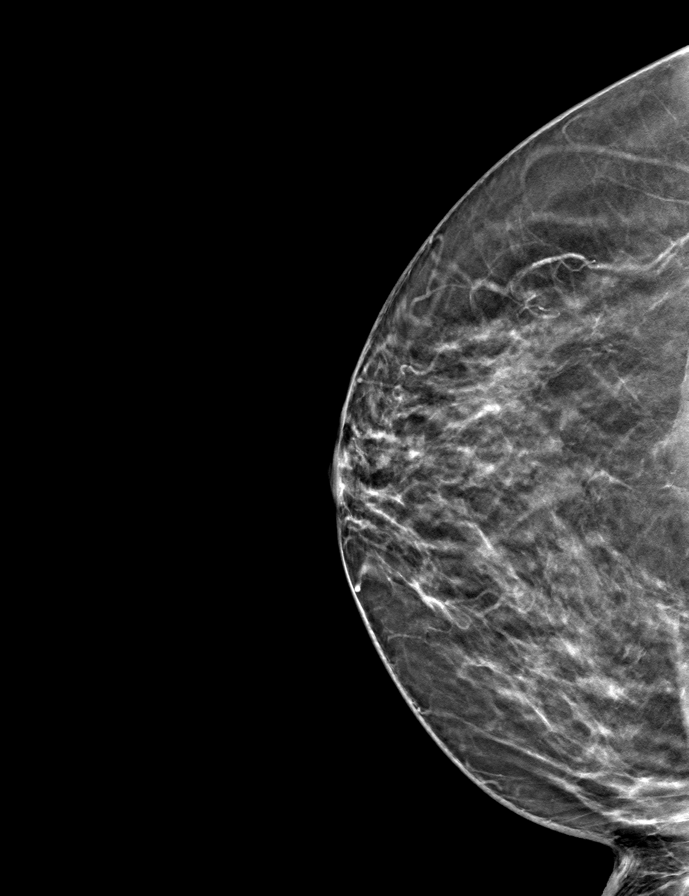

[9 of 24 positions shown; findings below may reference images not displayed]

ACR Breast Density Category c: The breast tissue is heterogeneously
dense, which may obscure small masses.
FINDINGS: There are no findings suspicious for malignancy. Images were
processed with CAD.
IMPRESSION: No mammographic evidence of malignancy. A result letter of this
screening mammogram will be mailed directly to the patient.

RECOMMENDATION:
Screening mammogram in one year. (Code:[id number])

BI-RADS CATEGORY  1: Negative.

## 2020-02-28 ENCOUNTER — Ambulatory Visit: Payer: Medicare Other | Attending: Internal Medicine

## 2020-02-28 DIAGNOSIS — Z23 Encounter for immunization: Secondary | ICD-10-CM

## 2020-02-28 NOTE — Progress Notes (Signed)
   Covid-19 Vaccination Clinic  Name:  PEARL BENTS    MRN: [id number] DOB: 10-Apr-1941  02/28/2020  Ms. Mcclatchey was observed post Covid-19 immunization for 15 minutes without incident. She was provided with Vaccine Information Sheet and instruction to access the V-Safe system.   Ms. Weatherly was instructed to call 911 with any severe reactions post vaccine: Marland Kitchen Difficulty breathing  . Swelling of face and throat  . A fast heartbeat  . A bad rash all over body  . Dizziness and weakness

## 2020-07-01 ENCOUNTER — Other Ambulatory Visit: Payer: Self-pay | Admitting: Family Medicine

## 2020-07-01 DIAGNOSIS — Z1231 Encounter for screening mammogram for malignant neoplasm of breast: Secondary | ICD-10-CM

## 2020-10-10 ENCOUNTER — Ambulatory Visit
Admission: RE | Admit: 2020-10-10 | Discharge: 2020-10-10 | Disposition: A | Discharge status: Home / Self Care | Payer: Medicare Other | Source: Ambulatory Visit | Attending: Family Medicine | Admitting: Family Medicine

## 2020-10-10 ENCOUNTER — Other Ambulatory Visit: Payer: Self-pay

## 2020-10-10 DIAGNOSIS — Z1231 Encounter for screening mammogram for malignant neoplasm of breast: Secondary | ICD-10-CM

## 2021-07-08 ENCOUNTER — Other Ambulatory Visit: Payer: Self-pay | Admitting: Family Medicine

## 2021-07-08 DIAGNOSIS — Z1231 Encounter for screening mammogram for malignant neoplasm of breast: Secondary | ICD-10-CM

## 2021-10-12 ENCOUNTER — Ambulatory Visit
Admission: RE | Admit: 2021-10-12 | Discharge: 2021-10-12 | Disposition: A | Discharge status: Home / Self Care | Payer: Medicare Other | Source: Ambulatory Visit | Attending: Family Medicine | Admitting: Family Medicine

## 2021-10-12 DIAGNOSIS — Z1231 Encounter for screening mammogram for malignant neoplasm of breast: Secondary | ICD-10-CM

## 2021-11-28 ENCOUNTER — Emergency Department (HOSPITAL_COMMUNITY): Payer: Medicare Other

## 2021-11-28 ENCOUNTER — Other Ambulatory Visit: Payer: Self-pay

## 2021-11-28 ENCOUNTER — Encounter (HOSPITAL_COMMUNITY): Payer: Self-pay | Admitting: Emergency Medicine

## 2021-11-28 ENCOUNTER — Emergency Department (HOSPITAL_COMMUNITY)
Admission: EM | Admit: 2021-11-28 | Discharge: 2021-11-28 | Disposition: A | Discharge status: Home / Self Care | Payer: Medicare Other | Attending: Emergency Medicine | Admitting: Emergency Medicine

## 2021-11-28 DIAGNOSIS — K2100 Gastro-esophageal reflux disease with esophagitis, without bleeding: Secondary | ICD-10-CM | POA: Diagnosis not present

## 2021-11-28 DIAGNOSIS — R9389 Abnormal findings on diagnostic imaging of other specified body structures: Secondary | ICD-10-CM | POA: Diagnosis not present

## 2021-11-28 DIAGNOSIS — R1013 Epigastric pain: Secondary | ICD-10-CM | POA: Diagnosis present

## 2021-11-28 DIAGNOSIS — R109 Unspecified abdominal pain: Secondary | ICD-10-CM

## 2021-11-28 LAB — COMPREHENSIVE METABOLIC PANEL
ALT: 14 U/L (ref 0–44)
AST: 16 U/L (ref 15–41)
Albumin: 3.8 g/dL (ref 3.5–5.0)
Alkaline Phosphatase: 87 U/L (ref 38–126)
Anion gap: 10 (ref 5–15)
BUN: 16 mg/dL (ref 8–23)
CO2: 23 mmol/L (ref 22–32)
Calcium: 9.3 mg/dL (ref 8.9–10.3)
Chloride: 108 mmol/L (ref 98–111)
Creatinine, Ser: 0.83 mg/dL (ref 0.44–1.00)
GFR, Estimated: >60 mL/min (ref >60)
Glucose, Bld: 151 mg/dL — ABNORMAL HIGH (ref 70–99)
Potassium: 3.8 mmol/L (ref 3.5–5.1)
Sodium: 141 mmol/L (ref 135–145)
Total Bilirubin: 0.6 mg/dL (ref 0.3–1.2)
Total Protein: 7.3 g/dL (ref 6.5–8.1)

## 2021-11-28 LAB — CBC
HCT: 40.8 % (ref 36.0–46.0)
Hemoglobin: 12.9 g/dL (ref 12.0–15.0)
MCH: 27.3 pg (ref 26.0–34.0)
MCHC: 31.6 g/dL (ref 30.0–36.0)
MCV: 86.4 fL (ref 80.0–100.0)
Platelets: 342 10*3/uL (ref 150–400)
RBC: 4.72 MIL/uL (ref 3.87–5.11)
RDW: 16.7 % — ABNORMAL HIGH (ref 11.5–15.5)
WBC: 6.3 10*3/uL (ref 4.0–10.5)
nRBC: 0 % (ref 0.0–0.2)

## 2021-11-28 LAB — URINALYSIS, ROUTINE W REFLEX MICROSCOPIC
Bilirubin Urine: NEGATIVE
Glucose, UA: NEGATIVE mg/dL
Hgb urine dipstick: NEGATIVE
Ketones, ur: NEGATIVE mg/dL
Nitrite: NEGATIVE
Protein, ur: NEGATIVE mg/dL
Specific Gravity, Urine: 1.038 — ABNORMAL HIGH (ref 1.005–1.030)
pH: 5 (ref 5.0–8.0)

## 2021-11-28 LAB — LIPASE, BLOOD: Lipase: 24 U/L (ref 11–51)

## 2021-11-28 MED ORDER — ONDANSETRON 4 MG PO TBDP: 4.0000 mg | ORAL_TABLET | Freq: Three times a day (TID) | ORAL | 0 refills | Status: AC | PRN

## 2021-11-28 MED ORDER — ONDANSETRON 4 MG PO TBDP: 4.0000 mg | ORAL_TABLET | Freq: Three times a day (TID) | ORAL | 0 refills | Status: DC | PRN

## 2021-11-28 MED ORDER — FAMOTIDINE IN NACL 20-0.9 MG/50ML-% IV SOLN
20.0000 mg | Freq: Once | INTRAVENOUS | Status: AC
  Administered 2021-11-28: 20 mg via INTRAVENOUS
  Filled 2021-11-28: qty 50

## 2021-11-28 MED ORDER — SODIUM CHLORIDE (PF) 0.9 % IJ SOLN
INTRAMUSCULAR | Status: AC
  Filled 2021-11-28: qty 50

## 2021-11-28 MED ORDER — FAMOTIDINE 20 MG PO TABS: 20.0000 mg | ORAL_TABLET | Freq: Two times a day (BID) | ORAL | 0 refills | Status: DC

## 2021-11-28 MED ORDER — FAMOTIDINE 20 MG PO TABS: 20.0000 mg | ORAL_TABLET | Freq: Two times a day (BID) | ORAL | 0 refills | Status: AC

## 2021-11-28 MED ORDER — IOHEXOL 300 MG/ML  SOLN
100.0000 mL | Freq: Once | INTRAMUSCULAR | Status: AC | PRN
  Administered 2021-11-28: 100 mL via INTRAVENOUS

## 2021-11-28 MED ORDER — ONDANSETRON HCL 4 MG/2ML IJ SOLN
4.0000 mg | Freq: Once | INTRAMUSCULAR | Status: AC
  Administered 2021-11-28: 4 mg via INTRAVENOUS
  Filled 2021-11-28: qty 2

## 2021-11-28 NOTE — ED Provider Notes (Signed)
[facility] DEPT Provider Note   CSN: [id number] Arrival date & time: 11/28/21  0254     History  Chief Complaint  Patient presents with   Abdominal Pain    Sabrina Sawyer is a 81 y.o. female presenting to ED with epigastric discomfort, bloating, ongoing for 1 week.  Nausea after eating, no vomiting.  Regular BM, no diarrhea.  No dysuria.  Reporting low-mid back pain past 2 days.    Reports hx of ovarian cyst removal, no other abdominal surgeries  Does not drink alcohol, denies hx of pancreatitis.  Reports hx of reflux/heartburn chronically  HPI     Home Medications Prior to Admission medications   Medication Sig Start Date End Date Taking? Authorizing Provider  famotidine (PEPCID) 20 MG tablet Take 1 tablet (20 mg total) by mouth 2 (two) times daily. Take 30 minutes before breakfast and dinner 11/28/21 12/28/21  Wyvonnia Dusky, MD  levothyroxine (SYNTHROID, LEVOTHROID) 50 MCG tablet Take 50 mcg by mouth daily. 02/13/13   [provider]  lisinopril (PRINIVIL,ZESTRIL) 5 MG tablet Take 5 mg by mouth. 10/23/13 10/23/14  [provider]  methocarbamol (ROBAXIN) 500 MG tablet Take 1 tablet (500 mg total) by mouth at bedtime and may repeat dose one time if needed. 09/17/16   Recardo Evangelist, PA-C  ondansetron (ZOFRAN-ODT) 4 MG disintegrating tablet Take 1 tablet (4 mg total) by mouth every 8 (eight) hours as needed for up to 12 doses for nausea or vomiting. 11/28/21   Wyvonnia Dusky, MD  simvastatin (ZOCOR) 20 MG tablet Take 40 mg by mouth daily.  02/13/13   [provider]      Allergies    Patient has no known allergies.    Review of Systems   Review of Systems  Physical Exam Updated Vital Signs BP (!) 143/87 (BP Location: Right Arm)   Pulse 62   Temp 97.6 F (36.4 C) (Oral)   Resp 14   Ht '5\' 7"'$  (1.702 m)   Wt 81.6 kg   SpO2 97%   BMI 28.19 kg/m  Physical Exam Constitutional:      General: She is  not in acute distress. HENT:     Head: Normocephalic and atraumatic.  Eyes:     Conjunctiva/sclera: Conjunctivae normal.     Pupils: Pupils are equal, round, and reactive to light.  Cardiovascular:     Rate and Rhythm: Normal rate and regular rhythm.  Pulmonary:     Effort: Pulmonary effort is normal. No respiratory distress.  Abdominal:     General: There is no distension.     Tenderness: There is abdominal tenderness in the epigastric area.  Skin:    General: Skin is warm and dry.  Neurological:     General: No focal deficit present.     Mental Status: She is alert. Mental status is at baseline.  Psychiatric:        Mood and Affect: Mood normal.        Behavior: Behavior normal.     ED Results / Procedures / Treatments   Labs (all labs ordered are listed, but only abnormal results are displayed) Labs Reviewed  COMPREHENSIVE METABOLIC PANEL - Abnormal; Notable for the following components:      Result Value   Glucose, Bld 151 (*)    All other components within normal limits  CBC - Abnormal; Notable for the following components:   RDW 16.7 (*)    All other components within normal  limits  URINALYSIS, ROUTINE W REFLEX MICROSCOPIC - Abnormal; Notable for the following components:   Specific Gravity, Urine 1.038 (*)    Leukocytes,Ua MODERATE (*)    Bacteria, UA RARE (*)    All other components within normal limits  LIPASE, BLOOD    EKG None  Radiology CT ABDOMEN PELVIS W CONTRAST  Result Date: 11/28/2021 CLINICAL DATA:  81 year old female with history of nausea vomiting and epigastric discomfort. Bloating. EXAM: CT ABDOMEN AND PELVIS WITH CONTRAST TECHNIQUE: Multidetector CT imaging of the abdomen and pelvis was performed using the standard protocol following bolus administration of intravenous contrast. RADIATION DOSE REDUCTION: This exam was performed according to the departmental dose-optimization program which includes automated exposure control, adjustment of the mA  and/or kV according to patient size and/or use of iterative reconstruction technique. CONTRAST:  126m OMNIPAQUE IOHEXOL 300 MG/ML  SOLN COMPARISON:  No priors. FINDINGS: Lower chest: Unremarkable. Hepatobiliary: Subcentimeter low-attenuation lesion in segment 6 of the liver, too small to characterize, but statistically likely tiny cysts (no imaging follow-up is recommended). No other aggressive appearing hepatic lesions. No intra or extrahepatic biliary ductal dilatation. Gallbladder is normal in appearance. Pancreas: No pancreatic mass. No pancreatic ductal dilatation. No pancreatic or peripancreatic fluid collections or inflammatory changes. Spleen: Unremarkable. Adrenals/Urinary Tract: 1.6 cm low-attenuation lesion in the lower pole of the left kidney, compatible with a simple cyst. Right kidney and bilateral adrenal glands are normal in appearance. No hydroureteronephrosis. Urinary bladder is normal in appearance. Stomach/Bowel: The appearance of the stomach is normal. No pathologic dilatation of small bowel or colon. A few scattered colonic diverticulae are noted, without surrounding inflammatory changes to suggest an acute diverticulitis at this time. The appendix is not confidently identified and may be surgically absent. Regardless, there are no inflammatory changes noted adjacent to the cecum to suggest the presence of an acute appendicitis at this time. Vascular/Lymphatic: Atherosclerotic calcifications throughout the abdominal aorta and pelvic vasculature, without evidence of aneurysm or dissection. No lymphadenopathy noted in the abdomen or pelvis. Reproductive: Extensive thickening of the endometrium which measures up to 3.3 cm in thickness (sagittal image 98 of series 6). Ovaries are atrophic. Other: No significant volume of ascites. No pneumoperitoneum. Musculoskeletal: There are no aggressive appearing lytic or blastic lesions noted in the visualized portions of the skeleton. IMPRESSION: 1. No acute  findings are noted in the abdomen or pelvis. 2. However, there is profound thickening of the endometrium, concerning for endometrial neoplasm. Further evaluation with pelvic ultrasound is strongly recommended in the near future. 3. Aortic atherosclerosis. 4. Additional incidental findings, as above. Electronically Signed   By: DVinnie LangtonM.D.   On: 11/28/2021 09:51    Procedures Procedures    Medications Ordered in ED Medications  sodium chloride (PF) 0.9 % injection (  Not Given 11/28/21 0821)  ondansetron (ZOFRAN) injection 4 mg (4 mg Intravenous Given 11/28/21 0857)  famotidine (PEPCID) IVPB 20 mg premix (0 mg Intravenous Stopped 11/28/21 1120)  iohexol (OMNIPAQUE) 300 MG/ML solution 100 mL (100 mLs Intravenous Contrast Given 11/28/21 0908)    ED Course/ Medical Decision Making/ A&P Clinical Course as of 11/28/21 1626  Sat Nov 28, 2021  1104 Patient is feeling significantly better after her GI medications, wanting to go home.  We discussed the findings on CT of endometrial thickening and she will follow-up with her PCP for a ultrasound.  Okay for discharge at this time. [MT]    Clinical Course User Index [MT] Garner Dullea, MCarola Rhine MD  Medical Decision Making Amount and/or Complexity of Data Reviewed Labs: ordered. Radiology: ordered.  Risk Prescription drug management.   This patient presents to the ED with concern for epigastric discomfort,bloating. This involves an extensive number of treatment options, and is a complaint that carries with it a high risk of complications and morbidity.  The differential diagnosis includes gastritis vs PUD vs pancreatitis vs biliary disease vs ileus vs other  Co-morbidities that complicate the patient evaluation: hx of reflux, higher risk for peptic ulcer   I ordered and personally interpreted labs.  The pertinent results include: Labs unremarkable   I ordered imaging studies including CT abdomen pelvis with  contrast I independently visualized and interpreted imaging which showed endometrial thickening which could be concerning for malignancy, no other acute emergent findings. I agree with the radiologist interpretation  The patient was maintained on a cardiac monitor.  I personally viewed and interpreted the cardiac monitored which showed an underlying rhythm of: NSR  I ordered medication including pepcid, zofran for nausea and possible gastritis I have reviewed the patients home medicines and have made adjustments as needed  Test Considered: low suspicion for ACS with this presentation; do not suspect mesenteric ischemia or AAA clinically  After the interventions noted above, I reevaluated the patient and found that they have: improved  disposition:  After consideration of the diagnostic results and the patients response to treatment, I feel that the patent would benefit from close outpatient pcp f/u.         Final Clinical Impression(s) / ED Diagnoses Final diagnoses:  Abdominal pain, unspecified abdominal location  Gastroesophageal reflux disease with esophagitis without hemorrhage  Increased endometrial stripe thickness    Rx / DC Orders ED Discharge Orders          Ordered    ondansetron (ZOFRAN-ODT) 4 MG disintegrating tablet  Every 8 hours PRN,   Status:  Discontinued        11/28/21 1120    famotidine (PEPCID) 20 MG tablet  2 times daily,   Status:  Discontinued        11/28/21 1120    famotidine (PEPCID) 20 MG tablet  2 times daily        11/28/21 1127    ondansetron (ZOFRAN-ODT) 4 MG disintegrating tablet  Every 8 hours PRN        11/28/21 1127              Wyvonnia Dusky, MD 11/28/21 1626

## 2021-11-28 NOTE — ED Triage Notes (Signed)
Pt reports that her stomach feels like it is "balling up." Also reports some vomiting. States that she was really sweaty yesterday.

## 2021-11-28 NOTE — Discharge Instructions (Signed)
Your CT scan showed that you have a thickened endometrium.  This could be a sign of cancer around the uterus, cervix or endometrium.  You will need to follow-up with your doctor and OB/GYN office for an ultrasound of the pelvis, which can be ordered by either doctor.  This would be part of the cancer screening.  In the meantime I prescribed you Zofran for nausea to take as needed, and Pepcid for possible reflux gastritis.

## 2022-06-18 ENCOUNTER — Telehealth: Payer: Self-pay | Admitting: Radiation Oncology

## 2022-06-18 NOTE — Telephone Encounter (Signed)
2/16 @ 10:38 am Left voicemail for patient to call our office to be schedule for an consult with Dr. Sondra Come.

## 2022-06-21 NOTE — Progress Notes (Incomplete)
GYN Location of Tumor / Histology: Endometrial  Sabrina Sawyer presented with symptoms of: from Dr. Shelba Flake H&P note: "Sabrina Sawyer was in her usual state of good health until July 2023 when she presented with nausea vomiting and epigastric pain and bloating. She subsequently went to the emergency room on November 28, 2021 where she had a CT scan performed which demonstrated no acute GI abnormalities but, she was noted to have extensive thickening of the endometrium measuring up to 3.3 cm in thickness."  Biopsies revealed:  04/23/2022 1. 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 -Cervix, bilateral ovaries and fallopian tubes with no significant histopathologic findings. -pT1b (sn)N0 M-not applicable; FIGO IB. -Please see synoptic report below. 2. Sentinel lymph node, right pelvic, excision: -One lymph node, negative for carcinoma; multiple levels and pan-cytokeratin stain (AE1:AE3) examined (0/1). 3. Sentinel lymph node, left pelvic, excision: -One lymph node, negative for carcinoma; multiple levels and pan-cytokeratin stain (AE1:AE3) examined (0/1). Addendum 1   - The endometrial adenocarcinoma was analyzed by [facility] for DNA mismatch repair proteins.  The neoplasm retained nuclear expression of two mismatch repair proteins,  MSH2 and MSH6, but nuclear expression for MLH1 and PMS2 was absent.   - Estrogen receptors (ER): Moderate, 99% Progesterone receptors (PR): Moderate, 99% - Controls worked appropriately. - Given the above results, MLH1 methylation studies will be pursued and reported in an additional addendum. 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  Past/Anticipated interventions by Gyn/Onc surgery, if any:   04/23/2022 Dr. Genia Del ROBOTIC HYSTERECTOMY BILATERAL SALPINGO OOPHORECTOMY SENTINEL NODE BIOPSY   Past/Anticipated interventions by medical oncology, if any:  No referral placed at this time  Weight changes, if any: ***  Bowel/Bladder complaints, if any: ***  Nausea/Vomiting, if any: ***  Pain issues, if any:  ***  SAFETY ISSUES: Prior radiation? *** Pacemaker/ICD? *** Possible current pregnancy? No--hysterectomy Is the patient on methotrexate? ***  Current Complaints / other details:  ***

## 2022-06-26 NOTE — Progress Notes (Signed)
Radiation Oncology         [phone] ________________________________  Initial Outpatient Consultation  Name: Sabrina Sawyer MRN: [id number]  Date: 06/28/2022  DOB: 03-30-1941  CC:Pcp, No  Sabrina Cobia Dayton Bailiff, MD   REFERRING PHYSICIAN: Hart Rochester, MD  DIAGNOSIS: There were no encounter diagnoses.  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 female who is accompanied by ***. she is seen as a courtesy of Dr. Polly Cobia for an opinion concerning radiation therapy as part of management for her recently diagnosed endometrial cancer.   The patient presented to the ED on 11/28/21 with 1 week of abdominal pain and bloating. CT of the abdomen and pelvis performed in the ED showed a profoundly thickened endometrium measuring 3.3 cm, concerning for endometrial neoplasm. No other acute findings in the abdomen or pelvis were appreciated.   Pelvic ultrasound on 12/08/21 confirmed a thickened endometrium, measuring 2.3 cm sonographically, containing fluid debris and soft tissue. The ovaries were not visualized.   Accordingly, the patient was referred to [facility] and underwent endometrial D&C on 03/23/22 under Dr. Willa Rough. 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   The patient was then referred to general surgery and opted to proceed with hysterectomy, BSO, and SLN evaluation on 04/23/22 under the care of Dr. Polly Cobia. 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 Nodal status of 2/2 sentinel pelvis lymph node excisions negative for carcinoma. Cervix, bilateral ovaries and fallopian tubes negative for carcinoma. ER 99% positive and PR 99% positive, both with moderate staining intensity.     - The neoplasm retained nuclear expression of two mismatch  repair proteins, MSH2 and MSH6, but nuclear expression for MLH1 and PMS2 were both absent. 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   For adjuvant treatment, Dr. Polly Cobia referred the patient to [place] for consideration of vaginal cuff brachytherapy given her increased risk for pelvic recurrence The patient will continue to meet with Dr. Polly Cobia for surveillance visits every 3 months.   PREVIOUS RADIATION THERAPY: No  PAST MEDICAL HISTORY:  Past Medical History:  Diagnosis Date   Diabetes mellitus without complication ([place])    Hyperlipemia    Hypertension    Thyroid disease     PAST SURGICAL HISTORY: Past Surgical History:  Procedure Laterality Date   cyst removal face      FAMILY HISTORY:  Family History  Problem Relation Age of Onset   Colon cancer Neg Hx    Esophageal cancer Neg Hx    Rectal cancer Neg Hx    Stomach cancer Neg Hx     SOCIAL HISTORY:  Social History   Tobacco Use   Smoking status: Never   Smokeless tobacco: Never  Vaping Use   Vaping Use: Never used  Substance Use Topics   Alcohol use: No   Drug use: No    ALLERGIES: No Known Allergies  MEDICATIONS:  Current Outpatient Medications  Medication Sig Dispense Refill   famotidine (PEPCID) 20 MG tablet Take 1 tablet (20 mg total) by mouth 2 (two) times daily. Take 30 minutes before breakfast and dinner 60 tablet 0   levothyroxine (SYNTHROID, LEVOTHROID) 50 MCG tablet Take 50 mcg by mouth daily.     lisinopril (PRINIVIL,ZESTRIL) 5 MG tablet Take 5 mg by mouth.     methocarbamol (ROBAXIN) 500 MG tablet Take 1  tablet (500 mg total) by mouth at bedtime and may repeat dose one time if needed. 10 tablet 0   ondansetron (ZOFRAN-ODT) 4 MG disintegrating tablet Take 1 tablet (4 mg total) by mouth every 8 (eight) hours as needed for up to 12 doses for nausea or vomiting. 12 tablet 0   simvastatin (ZOCOR) 20 MG tablet Take 40 mg by mouth daily.      No current facility-administered medications for  this encounter.    REVIEW OF SYSTEMS:  A 10+ POINT REVIEW OF SYSTEMS WAS OBTAINED including neurology, dermatology, psychiatry, cardiac, respiratory, lymph, extremities, GI, GU, musculoskeletal, constitutional, reproductive, HEENT. ***   PHYSICAL EXAM:  vitals were not taken for this visit.   General: Alert and oriented, in no acute distress HEENT: Head is normocephalic. Extraocular movements are intact. Oropharynx is clear. Neck: Neck is supple, no palpable cervical or supraclavicular lymphadenopathy. Heart: Regular in rate and rhythm with no murmurs, rubs, or gallops. Chest: Clear to auscultation bilaterally, with no rhonchi, wheezes, or rales. Abdomen: Soft, nontender, nondistended, with no rigidity or guarding. Extremities: No cyanosis or edema. Lymphatics: see Neck Exam Skin: No concerning lesions. Musculoskeletal: symmetric strength and muscle tone throughout. Neurologic: Cranial nerves II through XII are grossly intact. No obvious focalities. Speech is fluent. Coordination is intact. Psychiatric: Judgment and insight are intact. Affect is appropriate.  On pelvic examination the external genitalia were unremarkable. A speculum exam was performed. There are no mucosal lesions noted in the vaginal vault. A Pap smear was obtained of the proximal vagina. On bimanual and rectovaginal examination there were no pelvic masses appreciated. ***   ECOG = ***  0 - Asymptomatic (Fully active, able to carry on all predisease activities without restriction)  1 - Symptomatic but completely ambulatory (Restricted in physically strenuous activity but ambulatory and able to carry out work of a light or sedentary nature. For example, light housework, office work)  2 - Symptomatic, <50% in bed during the day (Ambulatory and capable of all self care but unable to carry out any work activities. Up and about more than 50% of waking hours)  3 - Symptomatic, >50% in bed, but not bedbound (Capable of only  limited self-care, confined to bed or chair 50% or more of waking hours)  4 - Bedbound (Completely disabled. Cannot carry on any self-care. Totally confined to bed or chair)  5 - Death   Eustace Pen MM, Creech RH, Tormey DC, et al. [phone]). "Toxicity and response criteria of the [facility]". Joanna Oncol. 5 (6): 649-55  LABORATORY DATA:  Lab Results  Component Value Date   WBC 6.3 11/28/2021   HGB 12.9 11/28/2021   HCT 40.8 11/28/2021   MCV 86.4 11/28/2021   PLT 342 11/28/2021   Lab Results  Component Value Date   NA 141 11/28/2021   K 3.8 11/28/2021   CL 108 11/28/2021   CO2 23 11/28/2021   GLUCOSE 151 (H) 11/28/2021   BUN 16 11/28/2021   CREATININE 0.83 11/28/2021   CALCIUM 9.3 11/28/2021      RADIOGRAPHY: No results found.    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  We discussed the natural history of *** and general treatment, highlighting the role of radiotherapy in the management.  We discussed the available radiation techniques, and focused on the details of logistics and delivery.  We reviewed the anticipated acute and late sequelae associated with radiation in this setting.  The patient was encouraged to ask questions that I answered to the best of my ability. *** A patient consent form was discussed and signed.  We retained a copy for our records.  The patient would like to proceed with radiation and will be scheduled for CT simulation.  PLAN: ***    *** minutes of total time was spent for this patient encounter, including preparation, face-to-face counseling with the patient and coordination of care, physical exam, and documentation of the encounter.   ------------------------------------------------  Blair Promise, PhD, MD  This document serves as a record of services personally performed by Gery Pray, MD. It was created on his behalf by Roney Mans, a trained medical scribe. The creation of this record is based on the scribe's personal observations and the provider's statements to them. This document has been checked and approved by the attending provider.

## 2022-06-28 ENCOUNTER — Encounter: Payer: Self-pay | Admitting: Radiation Oncology

## 2022-06-28 ENCOUNTER — Other Ambulatory Visit: Payer: Self-pay

## 2022-06-28 ENCOUNTER — Ambulatory Visit
Admission: RE | Admit: 2022-06-28 | Discharge: 2022-06-28 | Disposition: A | Discharge status: Home / Self Care | Payer: Medicare Other | Source: Ambulatory Visit | Attending: Radiation Oncology | Admitting: Radiation Oncology

## 2022-06-28 VITALS — BP 120/66 | HR 76 | Resp 18 | Wt 169.5 lb

## 2022-06-28 DIAGNOSIS — E119 Type 2 diabetes mellitus without complications: Secondary | ICD-10-CM | POA: Insufficient documentation

## 2022-06-28 DIAGNOSIS — C541 Malignant neoplasm of endometrium: Secondary | ICD-10-CM | POA: Insufficient documentation

## 2022-06-28 DIAGNOSIS — E079 Disorder of thyroid, unspecified: Secondary | ICD-10-CM | POA: Insufficient documentation

## 2022-06-28 DIAGNOSIS — Z79899 Other long term (current) drug therapy: Secondary | ICD-10-CM | POA: Insufficient documentation

## 2022-06-28 DIAGNOSIS — I10 Essential (primary) hypertension: Secondary | ICD-10-CM | POA: Insufficient documentation

## 2022-06-28 DIAGNOSIS — Z79890 Hormone replacement therapy (postmenopausal): Secondary | ICD-10-CM | POA: Insufficient documentation

## 2022-06-28 DIAGNOSIS — E785 Hyperlipidemia, unspecified: Secondary | ICD-10-CM | POA: Insufficient documentation

## 2022-06-28 NOTE — Progress Notes (Signed)
GYN Location of Tumor / Histology: Endometrial  Sabrina Sawyer presented with symptoms of: from Dr. Shelba Flake H&P note: "[name] was in her usual state of good health until July 2023 when she presented with nausea vomiting and epigastric pain and bloating. She subsequently went to the emergency room on November 28, 2021 where she had a CT scan performed which demonstrated no acute GI abnormalities but, she was noted to have extensive thickening of the endometrium measuring up to 3.3 cm in thickness."  Biopsies revealed:  04/23/2022 1. 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 -Cervix, bilateral ovaries and fallopian tubes with no significant histopathologic findings. -pT1b (sn)N0 M-not applicable; FIGO IB. -Please see synoptic report below. 2. Sentinel lymph node, right pelvic, excision: -One lymph node, negative for carcinoma; multiple levels and pan-cytokeratin stain (AE1:AE3) examined (0/1). 3. Sentinel lymph node, left pelvic, excision: -One lymph node, negative for carcinoma; multiple levels and pan-cytokeratin stain (AE1:AE3) examined (0/1). Addendum 1   - The endometrial adenocarcinoma was analyzed by [facility] for DNA mismatch repair proteins.  The neoplasm retained nuclear expression of two mismatch repair proteins,  MSH2 and MSH6, but nuclear expression for MLH1 and PMS2 was absent.   - Estrogen receptors (ER): Moderate, 99% Progesterone receptors (PR): Moderate, 99% - Controls worked appropriately. - Given the above results, MLH1 methylation studies will be pursued and reported in an additional addendum. 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  Past/Anticipated interventions by Gyn/Onc surgery, if any:   04/23/2022 Dr. Genia Del ROBOTIC HYSTERECTOMY BILATERAL SALPINGO OOPHORECTOMY SENTINEL NODE BIOPSY   Past/Anticipated interventions by medical oncology, if any:  No referral placed at this time  Weight changes, if any: Unsure  Bowel/Bladder complaints, if any:  States that she get constipated sometime.,states she has medication for relief.  Nausea/Vomiting, if any: no  Pain issues, if any:  no  SAFETY ISSUES: Prior radiation? no Pacemaker/ICD? no Possible current pregnancy? No--hysterectomy Is the patient on methotrexate? no  Current Complaints / other details:   Vitals:   06/28/22 1106  BP: 120/66  Pulse: 76  Resp: 18  SpO2: 100%  Weight: 76.9 kg

## 2022-07-02 NOTE — Progress Notes (Signed)
Patient is here today for new HDR vaginal cuff cylinder fitting and treatment.   Does the patient complain of any of the following:   Pain: No Abdominal bloating: Yes Diarrhea/Constipation: No Nausea/Vomiting: No Vaginal Discharge: No Blood in Urine or Stool: No Urinary Issues (dysuria/incomplete emptying/ incontinence/ increased frequency/urgency): No     Additional comments if applicable:      BP [id number] (BP Location: Right Arm, Patient Position: Sitting, Cuff Size: Normal)   Pulse 75   Temp 97.7 F (36.5 C)   Resp 20   Ht '5\' 6"'$  (1.676 m)   Wt 169 lb 6.4 oz (76.8 kg)   SpO2 100%   BMI 27.34 kg/m

## 2022-07-06 ENCOUNTER — Telehealth: Payer: Self-pay | Admitting: *Deleted

## 2022-07-06 NOTE — Progress Notes (Signed)
  Radiation Oncology         (336) [phone] ________________________________  Name: Sabrina Sawyer MRN: [id number]  Date: 07/07/2022  DOB: 1940-12-13  Vaginal Brachytherapy Procedure Note  CC: Pcp, No Hart Rochester, MD  No diagnosis found.  Diagnosis: The encounter diagnosis was Endometrial cancer ([place]).   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 ***  No significant interval history since the patient was seen in consultation on 06/28/22.   ALLERGIES: has No Known Allergies.  Meds: Current Outpatient Medications  Medication Sig Dispense Refill   famotidine (PEPCID) 20 MG tablet Take 1 tablet (20 mg total) by mouth 2 (two) times daily. Take 30 minutes before breakfast and dinner 60 tablet 0   famotidine (PEPCID) 20 MG tablet Take by mouth.     levothyroxine (SYNTHROID, LEVOTHROID) 50 MCG tablet Take 50 mcg by mouth daily.     lisinopril (PRINIVIL,ZESTRIL) 5 MG tablet Take 5 mg by mouth.     lisinopril (ZESTRIL) 5 MG tablet Take 1 tablet by mouth daily.     methocarbamol (ROBAXIN) 500 MG tablet Take 1 tablet (500 mg total) by mouth at bedtime and may repeat dose one time if needed. 10 tablet 0   ondansetron (ZOFRAN-ODT) 4 MG disintegrating tablet Take 1 tablet (4 mg total) by mouth every 8 (eight) hours as needed for up to 12 doses for nausea or vomiting. 12 tablet 0   simvastatin (ZOCOR) 20 MG tablet Take 40 mg by mouth daily.      simvastatin (ZOCOR) 40 MG tablet Take 1 tablet by mouth at bedtime.     No current facility-administered medications for this encounter.    Physical Findings: The patient is in no acute distress. Patient is alert and oriented.  vitals were not taken for this visit.   No palpable cervical, supraclavicular or axillary lymphoadenopathy. The heart has a regular rate and rhythm. The lungs are clear to auscultation.  Abdomen soft and non-tender.  On pelvic examination the external genitalia were unremarkable. A speculum exam was performed. Vaginal cuff intact, no mucosal lesions. On bimanual exam there were no pelvic masses appreciated.  Lab Findings: Lab Results  Component Value Date   WBC 6.3 11/28/2021   HGB 12.9 11/28/2021   HCT 40.8 11/28/2021   MCV 86.4 11/28/2021   PLT 342 11/28/2021    Radiographic Findings: No results found.  Impression: The encounter diagnosis was Endometrial cancer ([place]).   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 The patient will be treated with a *** cm diameter cylinder with a treatment length of *** cm. This distended the vaginal vault without undue discomfort. The patient tolerated the procedure well.  The patient was successfully fitted for a vaginal cylinder. The patient is appropriate to begin vaginal brachytherapy.   Plan: The patient will proceed with CT simulation and vaginal brachytherapy today.    _______________________________   Blair Promise, PhD, MD  This document serves as a record of services personally performed by Gery Pray, MD. It was created on his behalf by Roney Mans, a trained medical scribe. The creation of this record is based on the scribe's personal observations and the provider's statements to them. This document has been checked and approved by the attending provider.

## 2022-07-06 NOTE — Progress Notes (Signed)
  Radiation Oncology         [phone] ________________________________  Name: Sabrina Sawyer MRN: [id number]  Date: 07/07/2022  DOB: 01/19/1941  CC: Pcp, No  Seabron Spates, MD  HDR BRACHYTHERAPY NOTE  DIAGNOSIS: The encounter diagnosis was Endometrial cancer [facility]).   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 She will be treated with a 2.5 cm diameter segmented cylinder. This conforms to her anatomy without undue discomfort.  Vaginal brachytherapy procedure node: The patient was brought to the HDR suite. Identity was confirmed. All relevant records and images related to the planned course of therapy were reviewed. The patient freely provided informed written consent to proceed with treatment after reviewing the details related to the planned course of therapy. The consent form was witnessed and verified by the simulation staff. Then, the patient was set-up in a stable reproducible supine position for radiation therapy. Pelvic exam revealed the vaginal cuff to be intact . The patient's custom vaginal cylinder was placed in the proximal vagina. This was affixed to the CT/MR stabilization plate to prevent slippage. Patient tolerated the placement well.  Verification simulation note:  A fiducial marker was placed within the vaginal cylinder. An AP and lateral film was then obtained through the pelvis area. This documented accurate position of the vaginal cylinder for treatment.  HDR BRACHYTHERAPY TREATMENT  The remote afterloading device was affixed to the vaginal cylinder by catheter. Patient then proceeded to undergo her first high-dose-rate treatment directed at the proximal vagina. The patient was prescribed a dose of 6.0 gray to be delivered to the mucosal surface. Treatment length was 2.5 cm. Patient was treated with 1 channel using 6 dwell positions.  Treatment time was 205.9 seconds. Iridium 192 was the high-dose-rate source for treatment. The patient tolerated the treatment well. After completion of her therapy, a radiation survey was performed documenting return of the iridium source into the GammaMed safe.   PLAN: The patient will return next week for her second high-dose-rate treatment.  ________________________________  -----------------------------------  Billie Lade, PhD, MD  This document serves as a record of services personally performed by Antony Blackbird, MD. It was created on his behalf by Neena Rhymes, a trained medical scribe. The creation of this record is based on the scribe's personal observations and the provider's statements to them. This document has been checked and approved by the attending provider.'

## 2022-07-06 NOTE — Telephone Encounter (Signed)
Called patient to remind of New HDR [facility] for 07-07-22, spoke with patient and she is aware of these appts.

## 2022-07-07 ENCOUNTER — Ambulatory Visit
Admission: RE | Admit: 2022-07-07 | Discharge: 2022-07-07 | Discharge status: Home / Self Care | Payer: Medicare Other | Source: Ambulatory Visit | Attending: Radiation Oncology | Admitting: Radiation Oncology

## 2022-07-07 ENCOUNTER — Ambulatory Visit
Admission: RE | Admit: 2022-07-07 | Discharge: 2022-07-07 | Disposition: A | Discharge status: Home / Self Care | Payer: Medicare Other | Source: Ambulatory Visit | Attending: Radiation Oncology | Admitting: Radiation Oncology

## 2022-07-07 ENCOUNTER — Other Ambulatory Visit: Payer: Self-pay

## 2022-07-07 ENCOUNTER — Encounter: Payer: Self-pay | Admitting: Radiation Oncology

## 2022-07-07 VITALS — BP 114/65 | HR 75 | Temp 97.7°F | Resp 20 | Ht 66.0 in | Wt 169.4 lb

## 2022-07-07 DIAGNOSIS — C541 Malignant neoplasm of endometrium: Secondary | ICD-10-CM | POA: Insufficient documentation

## 2022-07-07 DIAGNOSIS — Z79899 Other long term (current) drug therapy: Secondary | ICD-10-CM | POA: Diagnosis not present

## 2022-07-07 DIAGNOSIS — Z79890 Hormone replacement therapy (postmenopausal): Secondary | ICD-10-CM | POA: Diagnosis not present

## 2022-07-07 DIAGNOSIS — Z510 Encounter for antineoplastic radiation therapy: Secondary | ICD-10-CM | POA: Insufficient documentation

## 2022-07-07 LAB — RAD ONC ARIA SESSION SUMMARY
Course Elapsed Days: 0
Plan Fractions Treated to Date: 1
Plan Prescribed Dose Per Fraction: 6 Gy
Plan Total Fractions Prescribed: 5
Plan Total Prescribed Dose: 30 Gy
Reference Point Dosage Given to Date: 6 Gy
Reference Point Session Dosage Given: 6 Gy
Session Number: 1

## 2022-07-13 ENCOUNTER — Telehealth: Payer: Self-pay | Admitting: *Deleted

## 2022-07-13 NOTE — Telephone Encounter (Signed)
CALLED PATIENT TO REMIND OF HDR [place]. FOR 07-14-22 @ 9 AM, LVM FOR A RETURN CALL

## 2022-07-13 NOTE — Progress Notes (Signed)
  Radiation Oncology         [phone] ________________________________  Name: Sabrina Sawyer MRN: [id number]  Date: 07/14/2022  DOB: 1941/02/14  CC: Pcp, No  Hart Rochester, MD  HDR BRACHYTHERAPY NOTE  DIAGNOSIS: The encounter diagnosis was Endometrial cancer ([place]).   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 She will be treated with a 2.5 cm diameter segmented cylinder. This conforms to her anatomy without undue discomfort.  Vaginal brachytherapy procedure node: The patient was brought to the [place] suite. Identity was confirmed. All relevant records and images related to the planned course of therapy were reviewed. The patient freely provided informed written consent to proceed with treatment after reviewing the details related to the planned course of therapy. The consent form was witnessed and verified by the simulation staff. Then, the patient was set-up in a stable reproducible supine position for radiation therapy. Pelvic exam revealed the vaginal cuff to be intact . The patient's custom vaginal cylinder was placed in the proximal vagina. This was affixed to the CT/MR stabilization plate to prevent slippage. Patient tolerated the placement well.  Verification simulation note:  A fiducial marker was placed within the vaginal cylinder. An AP and lateral film was then obtained through the pelvis area. This documented accurate position of the vaginal cylinder for treatment.  HDR BRACHYTHERAPY TREATMENT  The remote afterloading device was affixed to the vaginal cylinder by catheter. Patient then proceeded to undergo her second high-dose-rate treatment directed at the proximal vagina. The patient was prescribed a dose of 6.0 gray to be delivered to the mucosal surface. Treatment length was 2.5 cm. Patient was treated with 1 channel using 6 dwell positions.  Treatment time was 220.0 seconds. Iridium 192 was the high-dose-rate source for treatment. The patient tolerated the treatment well. After completion of her therapy, a radiation survey was performed documenting return of the iridium source into the GammaMed safe.   PLAN: The patient will return next week for her third high-dose-rate treatment.  ________________________________  -----------------------------------  Blair Promise, PhD, MD  This document serves as a record of services personally performed by Gery Pray, MD. It was created on his behalf by Roney Mans, a trained medical scribe. The creation of this record is based on the scribe's personal observations and the provider's statements to them. This document has been checked and approved by the attending provider.'

## 2022-07-14 ENCOUNTER — Ambulatory Visit
Admission: RE | Admit: 2022-07-14 | Discharge: 2022-07-14 | Disposition: A | Discharge status: Home / Self Care | Payer: Medicare Other | Source: Ambulatory Visit | Attending: Radiation Oncology | Admitting: Radiation Oncology

## 2022-07-14 ENCOUNTER — Other Ambulatory Visit: Payer: Self-pay

## 2022-07-14 DIAGNOSIS — C541 Malignant neoplasm of endometrium: Secondary | ICD-10-CM

## 2022-07-14 DIAGNOSIS — Z510 Encounter for antineoplastic radiation therapy: Secondary | ICD-10-CM | POA: Diagnosis not present

## 2022-07-14 LAB — RAD ONC ARIA SESSION SUMMARY
Course Elapsed Days: 7
Plan Fractions Treated to Date: 2
Plan Prescribed Dose Per Fraction: 6 Gy
Plan Total Fractions Prescribed: 5
Plan Total Prescribed Dose: 30 Gy
Reference Point Dosage Given to Date: 12 Gy
Reference Point Session Dosage Given: 6 Gy
Session Number: 2

## 2022-07-15 ENCOUNTER — Telehealth: Payer: Self-pay | Admitting: *Deleted

## 2022-07-15 NOTE — Telephone Encounter (Signed)
RETURNED PATIENT'S PHONE CALL, SPOKE WITH PATIENT. ?

## 2022-07-15 NOTE — Telephone Encounter (Signed)
XXXX 

## 2022-07-15 NOTE — Telephone Encounter (Signed)
Returned patient's phone call, lvm for a return call 

## 2022-07-16 ENCOUNTER — Telehealth: Payer: Self-pay | Admitting: *Deleted

## 2022-07-16 NOTE — Telephone Encounter (Signed)
RETURNED PATIENT'S PHONE CALL, LVM FOR A RETURN CALL 

## 2022-07-21 ENCOUNTER — Telehealth: Payer: Self-pay | Admitting: *Deleted

## 2022-07-21 NOTE — Progress Notes (Signed)
  Radiation Oncology         [phone] ________________________________  Name: Sabrina Sawyer MRN: [id number]  Date: 07/22/2022  DOB: [birth year]  CC: Pcp, No  Hart Rochester, MD  HDR BRACHYTHERAPY NOTE  DIAGNOSIS: The encounter diagnosis was Endometrial cancer [facility]).   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 She will be treated with a 2.5 cm diameter segmented cylinder. This conforms to her anatomy without undue discomfort.  Vaginal brachytherapy procedure node: The patient was brought to the [place] suite. Identity was confirmed. All relevant records and images related to the planned course of therapy were reviewed. The patient freely provided informed written consent to proceed with treatment after reviewing the details related to the planned course of therapy. The consent form was witnessed and verified by the simulation staff. Then, the patient was set-up in a stable reproducible supine position for radiation therapy. Pelvic exam revealed the vaginal cuff to be intact . The patient's custom vaginal cylinder was placed in the proximal vagina. This was affixed to the CT/MR stabilization plate to prevent slippage. Patient tolerated the placement well.  Verification simulation note:  A fiducial marker was placed within the vaginal cylinder. An AP and lateral film was then obtained through the pelvis area. This documented accurate position of the vaginal cylinder for treatment.  HDR BRACHYTHERAPY TREATMENT  The remote afterloading device was affixed to the vaginal cylinder by catheter. Patient then proceeded to undergo her third high-dose-rate treatment directed at the proximal vagina. The patient was prescribed a dose of 6.0 gray to be delivered to the mucosal surface. Treatment length was 2.5 cm. Patient was treated with 1 channel using 6 dwell positions.  Treatment time was 237.1 seconds. Iridium 192 was the high-dose-rate source for treatment. The patient tolerated the treatment well. After completion of her therapy, a radiation survey was performed documenting return of the iridium source into the GammaMed safe.   PLAN: The patient will return next week for her fourth high-dose-rate treatment.  ________________________________  -----------------------------------  Blair Promise, PhD, MD  This document serves as a record of services personally performed by Gery Pray, MD. It was created on his behalf by Roney Mans, a trained medical scribe. The creation of this record is based on the scribe's personal observations and the provider's statements to them. This document has been checked and approved by the attending provider.'

## 2022-07-21 NOTE — Telephone Encounter (Signed)
CALLED PATIENT TO REMIND OF HDR [place] 07-22-22 @ 10 AM, LVM FOR A RETURN CALL

## 2022-07-22 ENCOUNTER — Ambulatory Visit
Admission: RE | Admit: 2022-07-22 | Discharge: 2022-07-22 | Disposition: A | Discharge status: Home / Self Care | Payer: Medicare Other | Source: Ambulatory Visit | Attending: Radiation Oncology | Admitting: Radiation Oncology

## 2022-07-22 ENCOUNTER — Other Ambulatory Visit: Payer: Self-pay

## 2022-07-22 DIAGNOSIS — Z510 Encounter for antineoplastic radiation therapy: Secondary | ICD-10-CM | POA: Diagnosis not present

## 2022-07-22 DIAGNOSIS — C541 Malignant neoplasm of endometrium: Secondary | ICD-10-CM

## 2022-07-22 LAB — RAD ONC ARIA SESSION SUMMARY
Course Elapsed Days: 15
Plan Fractions Treated to Date: 3
Plan Prescribed Dose Per Fraction: 6 Gy
Plan Total Fractions Prescribed: 5
Plan Total Prescribed Dose: 30 Gy
Reference Point Dosage Given to Date: 18 Gy
Reference Point Session Dosage Given: 6 Gy
Session Number: 3

## 2022-07-23 ENCOUNTER — Telehealth: Payer: Self-pay | Admitting: *Deleted

## 2022-07-23 NOTE — Progress Notes (Signed)
  Radiation Oncology         (336) [phone] ________________________________  Name: Sabrina Sawyer MRN: [id number]  Date: 07/26/2022  DOB: 05-07-1940  CC: Pcp, No  Hart Rochester, MD  HDR BRACHYTHERAPY NOTE  DIAGNOSIS: The encounter diagnosis was Endometrial cancer [facility]).   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 She will be treated with a 2.5 cm diameter segmented cylinder. This conforms to her anatomy without undue discomfort.  Vaginal brachytherapy procedure node: The patient was brought to the [place] suite. Identity was confirmed. All relevant records and images related to the planned course of therapy were reviewed. The patient freely provided informed written consent to proceed with treatment after reviewing the details related to the planned course of therapy. The consent form was witnessed and verified by the simulation staff. Then, the patient was set-up in a stable reproducible supine position for radiation therapy. Pelvic exam revealed the vaginal cuff to be intact . The patient's custom vaginal cylinder was placed in the proximal vagina. This was affixed to the CT/MR stabilization plate to prevent slippage. Patient tolerated the placement well.  Verification simulation note:  A fiducial marker was placed within the vaginal cylinder. An AP and lateral film was then obtained through the pelvis area. This documented accurate position of the vaginal cylinder for treatment.  HDR BRACHYTHERAPY TREATMENT  The remote afterloading device was affixed to the vaginal cylinder by catheter. Patient then proceeded to undergo her fourth high-dose-rate treatment directed at the proximal vagina. The patient was prescribed a dose of 6.0 gray to be delivered to the mucosal surface. Treatment length was 2.5 cm. Patient was treated with 1 channel using 6 dwell positions.  Treatment time was 246.2 seconds. Iridium 192 was the high-dose-rate source for treatment. The patient tolerated the treatment well. After completion of her therapy, a radiation survey was performed documenting return of the iridium source into the GammaMed safe.   PLAN: The patient will return next week for her fifth high-dose-rate treatment.  ________________________________  -----------------------------------  Blair Promise, PhD, MD  This document serves as a record of services personally performed by Gery Pray, MD. It was created on his behalf by Roney Mans, a trained medical scribe. The creation of this record is based on the scribe's personal observations and the provider's statements to them. This document has been checked and approved by the attending provider.'

## 2022-07-23 NOTE — Telephone Encounter (Signed)
CALLED PATIENT TO REMIND OF HDR [place] 07-26-22 @ 10 AM, SPOKE WITH PATIENT AND SHE IS AWARE OF THIS APPT.

## 2022-07-26 ENCOUNTER — Ambulatory Visit
Admission: RE | Admit: 2022-07-26 | Discharge: 2022-07-26 | Disposition: A | Discharge status: Home / Self Care | Payer: Medicare Other | Source: Ambulatory Visit | Attending: Radiation Oncology | Admitting: Radiation Oncology

## 2022-07-26 ENCOUNTER — Other Ambulatory Visit: Payer: Self-pay

## 2022-07-26 DIAGNOSIS — Z510 Encounter for antineoplastic radiation therapy: Secondary | ICD-10-CM | POA: Diagnosis not present

## 2022-07-26 DIAGNOSIS — C541 Malignant neoplasm of endometrium: Secondary | ICD-10-CM

## 2022-07-26 LAB — RAD ONC ARIA SESSION SUMMARY
Course Elapsed Days: 19
Plan Fractions Treated to Date: 4
Plan Prescribed Dose Per Fraction: 6 Gy
Plan Total Fractions Prescribed: 5
Plan Total Prescribed Dose: 30 Gy
Reference Point Dosage Given to Date: 24 Gy
Reference Point Session Dosage Given: 6 Gy
Session Number: 4

## 2022-07-30 ENCOUNTER — Telehealth: Payer: Self-pay | Admitting: *Deleted

## 2022-07-30 NOTE — Progress Notes (Signed)
  Radiation Oncology         [phone] ________________________________  Name: Sabrina Sawyer MRN: [id number]  Date: 08/02/2022  DOB: 1940-07-29  CC: Pcp, No  Hart Rochester, MD  HDR BRACHYTHERAPY NOTE  DIAGNOSIS: The encounter diagnosis was Endometrial cancer ([place]).   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 She will be treated with a 2.5 cm diameter segmented cylinder. This conforms to her anatomy without undue discomfort.  Vaginal brachytherapy procedure node: The patient was brought to the [place] suite. Identity was confirmed. All relevant records and images related to the planned course of therapy were reviewed. The patient freely provided informed written consent to proceed with treatment after reviewing the details related to the planned course of therapy. The consent form was witnessed and verified by the simulation staff. Then, the patient was set-up in a stable reproducible supine position for radiation therapy. Pelvic exam revealed the vaginal cuff to be intact . The patient's custom vaginal cylinder was placed in the proximal vagina. This was affixed to the CT/MR stabilization plate to prevent slippage. Patient tolerated the placement well.  Verification simulation note:  A fiducial marker was placed within the vaginal cylinder. An AP and lateral film was then obtained through the pelvis area. This documented accurate position of the vaginal cylinder for treatment.  HDR BRACHYTHERAPY TREATMENT  The remote afterloading device was affixed to the vaginal cylinder by catheter. Patient then proceeded to undergo her fifth high-dose-rate treatment directed at the proximal vagina. The patient was prescribed a dose of 6.0 gray to be delivered to the mucosal surface. Treatment length was 2.5 cm. Patient was treated with 1 channel using 6 dwell positions.  Treatment time was 263.0 seconds. Iridium 192 was the high-dose-rate source for treatment. The patient tolerated the treatment well. After completion of her therapy, a radiation survey was performed documenting return of the iridium source into the GammaMed safe.   PLAN: The patient has completed her fifth and final high-dose-rate treatment. She tolerated treatment well overall without any significant side effects. She will return here for routine follow-up in one month.  ________________________________  -----------------------------------  Blair Promise, PhD, MD  This document serves as a record of services personally performed by Gery Pray, MD. It was created on his behalf by Roney Mans, a trained medical scribe. The creation of this record is based on the scribe's personal observations and the provider's statements to them. This document has been checked and approved by the attending provider.'

## 2022-07-30 NOTE — Telephone Encounter (Signed)
CALLED PATIENT TO REMIND OF HDR [place] 08-02-22 @ 10 AM, LVM FOR A RETURN CALL

## 2022-08-02 ENCOUNTER — Other Ambulatory Visit: Payer: Self-pay

## 2022-08-02 ENCOUNTER — Ambulatory Visit
Admission: RE | Admit: 2022-08-02 | Discharge: 2022-08-02 | Disposition: A | Discharge status: Home / Self Care | Payer: Medicare Other | Source: Ambulatory Visit | Attending: Radiation Oncology | Admitting: Radiation Oncology

## 2022-08-02 DIAGNOSIS — C541 Malignant neoplasm of endometrium: Secondary | ICD-10-CM | POA: Diagnosis present

## 2022-08-02 LAB — RAD ONC ARIA SESSION SUMMARY
Course Elapsed Days: 26
Plan Fractions Treated to Date: 5
Plan Prescribed Dose Per Fraction: 6 Gy
Plan Total Fractions Prescribed: 5
Plan Total Prescribed Dose: 30 Gy
Reference Point Dosage Given to Date: 30 Gy
Reference Point Session Dosage Given: 6 Gy
Session Number: 5

## 2022-08-04 NOTE — Radiation Completion Notes (Signed)
Patient Name: Sabrina Sawyer, Sabrina Sawyer MRN: [id number] Date of Birth: 08/25/1940 Referring Physician: Genia Del, M.D. Date of Service: 2022-08-04 Radiation Oncologist: Teryl Lucy, M.D. [place] [facility] END OF TREATMENT NOTE     Diagnosis: C54.1 Malignant neoplasm of endometrium Intent: Curative     ==========DELIVERED PLANS==========  First Treatment Date: 2022-07-07 - Last Treatment Date: 2022-08-02   Plan Name: Vagina Site: Vagina Technique: HDR Ir-192 Mode: Brachytherapy Dose Per Fraction: 6 Gy Prescribed Dose (Delivered / Prescribed): 30 Gy / 30 Gy Prescribed Fxs (Delivered / Prescribed): 5 / 5     ==========ON TREATMENT VISIT DATES========== 2022-07-07, 2022-07-14, 2022-07-22, 2022-07-26, 2022-08-02     ==========UPCOMING VISITS==========       ==========APPENDIX - ON TREATMENT VISIT NOTES==========   See weekly On Treatment Notes is Epic for details.

## 2022-09-01 ENCOUNTER — Encounter: Payer: Self-pay | Admitting: Radiation Oncology

## 2022-09-01 NOTE — Progress Notes (Signed)
  Radiation Oncology         [phone] ________________________________  Name: Sabrina Sawyer MRN: [id number]  Date: 09/02/2022  DOB: [birth year]  End of Treatment Note  Diagnosis: The encounter diagnosis was Endometrial cancer ([facility]).   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   Plan: The patient has completed radiation treatment. The patient will return to radiation oncology clinic for routine follow-up in one month. I advised them to call or return sooner if they have any questions or concerns related to their recovery or treatment.  -----------------------------------  Billie Lade, PhD, MD  This document serves as a record of services personally performed by Antony Blackbird, MD. It was created on his behalf by Neena Rhymes, a trained medical scribe. The creation of this record is based on the scribe's personal observations and the provider's statements to them. This document has been checked and approved by the attending provider.

## 2022-09-01 NOTE — Progress Notes (Signed)
Radiation Oncology         [phone] ________________________________  Name: Sabrina Sawyer MRN: [id number]  Date: 09/02/2022  DOB: 1940/11/26  Follow-Up Visit Note  CC: Pcp, No  Seabron Spates, MD  No diagnosis found.  Diagnosis: The encounter diagnosis was Endometrial cancer (HCC).   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 She tolerated treatment well overall without any significant side effects.          No significant interval history since the patient completed brachytherapy.        ***                  Allergies:  has No Known Allergies.  Meds: Current Outpatient Medications  Medication Sig Dispense Refill   famotidine (PEPCID) 20 MG tablet Take 1 tablet (20 mg total) by mouth 2 (two) times daily. Take 30 minutes before breakfast and dinner 60 tablet 0   famotidine (PEPCID) 20 MG tablet Take by mouth.     levothyroxine (SYNTHROID, LEVOTHROID) 50 MCG tablet Take 50 mcg by mouth daily.     lisinopril (PRINIVIL,ZESTRIL) 5 MG tablet Take 5 mg by mouth.     lisinopril (ZESTRIL) 5 MG tablet Take 1 tablet by mouth daily.     methocarbamol (ROBAXIN) 500 MG tablet Take 1 tablet (500 mg total) by mouth at bedtime and may repeat dose one time if needed. 10 tablet 0   ondansetron (ZOFRAN-ODT) 4 MG disintegrating tablet Take 1 tablet (4 mg total) by mouth every 8 (eight) hours as needed for up to 12 doses for nausea or vomiting. 12 tablet 0   simvastatin (ZOCOR) 20 MG tablet Take 40 mg by mouth daily.      simvastatin (ZOCOR) 40 MG tablet Take 1 tablet by mouth at bedtime.     No  current facility-administered medications for this encounter.    Physical Findings: The patient is in no acute distress. Patient is alert and oriented.  vitals were not taken for this visit. .  No significant changes. Lungs are clear to auscultation bilaterally. Heart has regular rate and rhythm. No palpable cervical, supraclavicular, or axillary adenopathy. Abdomen soft, non-tender, normal bowel sounds.   Lab Findings: Lab Results  Component Value Date   WBC 6.3 11/28/2021   HGB 12.9 11/28/2021   HCT 40.8 11/28/2021   MCV 86.4 11/28/2021   PLT 342 11/28/2021    Radiographic Findings: No results found.  Impression: The encounter diagnosis was Endometrial cancer (HCC).   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  ***  Plan:  ***   *** minutes of total time was spent for this patient encounter, including preparation, face-to-face counseling with the patient and coordination of care, physical exam, and documentation of the encounter. ____________________________________  Billie Lade, PhD, MD  This document serves as a record of services personally performed by Antony Blackbird, MD. It was created on his behalf by Neena Rhymes, a trained medical scribe. The creation of this record is based  on the scribe's personal observations and the provider's statements to them. This document has been checked and approved by the attending provider.

## 2022-09-02 ENCOUNTER — Ambulatory Visit
Admission: RE | Admit: 2022-09-02 | Discharge: 2022-09-02 | Disposition: A | Discharge status: Home / Self Care | Payer: Medicare Other | Source: Ambulatory Visit | Attending: Radiation Oncology | Admitting: Radiation Oncology

## 2022-09-02 ENCOUNTER — Encounter: Payer: Self-pay | Admitting: Radiation Oncology

## 2022-09-02 VITALS — BP 126/57 | HR 63 | Temp 97.8°F | Resp 20 | Ht 66.0 in | Wt 174.4 lb

## 2022-09-02 DIAGNOSIS — Z923 Personal history of irradiation: Secondary | ICD-10-CM | POA: Diagnosis not present

## 2022-09-02 DIAGNOSIS — C541 Malignant neoplasm of endometrium: Secondary | ICD-10-CM | POA: Insufficient documentation

## 2022-09-02 HISTORY — DX: Personal history of irradiation: Z92.3

## 2022-09-02 NOTE — Progress Notes (Signed)
Sabrina Sawyer is here today for follow up post radiation to the pelvic.  They completed their radiation on: 08/02/22   Does the patient complain of any of the following:  Pain:No Abdominal bloating: No Diarrhea/Constipation: Constipation Nausea/Vomiting: no Vaginal Discharge: No Blood in Urine or Stool: No Urinary Issues (dysuria/incomplete emptying/ incontinence/ increased frequency/urgency): Urinary frequency.  Does patient report using vaginal dilator 2-3 times a week and/or sexually active 2-3 weeks:  Patient given xs+ and S vaginal dilators with instructions and importance of use. Patient voiced understanding.  Post radiation skin changes: No   Additional comments if applicable:  BP (!) 126/57 (BP Location: Right Arm, Patient Position: Sitting, Cuff Size: Large)   Pulse 63   Temp 97.8 F (36.6 C)   Resp 20   Ht 5\' 6"  (1.676 m)   Wt 174 lb 6.4 oz (79.1 kg)   SpO2 100%   BMI 28.15 kg/m

## 2022-09-03 ENCOUNTER — Other Ambulatory Visit: Payer: Self-pay | Admitting: Family Medicine

## 2022-09-03 DIAGNOSIS — Z1231 Encounter for screening mammogram for malignant neoplasm of breast: Secondary | ICD-10-CM

## 2022-10-14 ENCOUNTER — Ambulatory Visit
Admission: RE | Admit: 2022-10-14 | Discharge: 2022-10-14 | Disposition: A | Discharge status: Home / Self Care | Payer: Medicare Other | Source: Ambulatory Visit | Attending: Family Medicine | Admitting: Family Medicine

## 2022-10-14 DIAGNOSIS — Z1231 Encounter for screening mammogram for malignant neoplasm of breast: Secondary | ICD-10-CM

## 2023-08-03 NOTE — Progress Notes (Signed)
 [facility]  08/03/2023    Patient ID:  Sabrina Sawyer is a 83 y.o. (DOB Feb 23, 1941) female.  Assessment and Plan  Ozell was seen today for tongue pain.  Diagnoses and all orders for this visit:  Oral pain -     triamcinolone (KENALOG) 0.1% paste; Use as directed in the mouth or throat 2 (two) times daily.  Acute pain of left knee Comments: Tylenol  twice daily as needed   Assessment & Plan 1. Sore throat. She reports a sore throat that started about a week ago, accompanied by sores on the tip of her tongue and a dry roof of the mouth. There is no associated fever, chills, or weight loss. A prescription for Kenalog in [place] paste has been issued, with instructions to apply a thin layer twice daily.  2. Knee pain. She has been experiencing knee pain for a couple of weeks. There is no swelling observed. She is advised to take Tylenol  twice daily for pain management.  3. Health maintenance. She is scheduled for a physical examination next month. Her blood pressure has been stable.  Follow-up The patient will follow up on 09/08/2023 at 10:00 AM.  No follow-ups on file.   Health Maintenance Due  Topic Date Due  . RSV Adult and Pregnancy (1 - 1-dose 75+ series) Never done  . DTaP/Tdap/Td Vaccines (2 - Td or Tdap) 09/15/2021  . Diabetes Eye Exam  07/15/2022  . DEXA Scan  07/23/2022  . Diabetes GFR/EGFR  06/10/2023  . Diabetes Annual Microalbumin/Creatinine Ratio  06/10/2023  . Diabetes Foot Exam  06/10/2023  . HgbA1C  06/10/2023     Risks, benefits, and alternatives of the medications and treatment plan prescribed today were discussed, and patient expressed understanding. Plan follow-up as discussed or as needed if any worsening symptoms or change in condition.    A yearly preventative health exam was recommended and current age based recommendations were discussed.   Subjective   Patient ID:  Sabrina Sawyer is a 83  y.o. (DOB [birth year]) female    Patient presents with  . Tongue Pain     History of Present Illness The patient presents for evaluation of a sore throat and knee pain.  She reports a persistent sore throat that began approximately one week ago, accompanied by redness on the underside of her tongue and tenderness at the tip. She also notes the presence of small sores on her toes. She recalls an incident last Friday when she noticed a white substance on the tip of her tongue, which was followed by the onset of her sore throat. Additionally, she describes a sensation of dryness in the roof of her mouth and difficulty swallowing. She has not observed any swollen glands, fevers, chills, or weight loss. She has been managing these symptoms with salt water gargles.  She has been experiencing knee pain for the past few weeks, which she attributes to yard work. Despite the pain, she maintains an active lifestyle, including walking and driving. She has not sought relief from Tylenol .  She is scheduled for a physical examination next month. Her blood pressure has been stable.    Outpatient Medications Marked as Taking for the 08/03/23 encounter (Office Visit) with Alm FORBES Bilis, MD  Medication Sig Dispense Refill  . aspirin  325 mg tablet Take half tablet by mouth twice daily    . diphenhydrAMINE (BENADRYL) 25 mg tablet Take one tablet (25 mg dose) by mouth every 8 (eight)  hours as needed.    . famotidine  (PEPCID ) 20 mg tablet TAKE 1 TABLET BY MOUTH TWICE DAILY AS NEEDED FOR  HEARTBURN 60 tablet 0  . levothyroxine  sodium (SYNTHROID ,LEVOTHROID,LOVOXYL) 50 mcg tablet Take one tablet (50 mcg dose) by mouth daily. 90 tablet 3  . lisinopril (PRINIVIL,ZESTRIL) 5 mg tablet Take one tablet (5 mg dose) by mouth daily. 90 tablet 3  . metFORMIN (GLUCOPHAGE) 500 mg tablet Take one tablet (500 mg dose) by mouth with breakfast. 90 tablet 3  . [DISCONTINUED] ondansetron  (ZOFRAN -ODT) 4 mg disintegrating tablet DISSOLVE 1  TABLET IN MOUTH EVERY 12 HOURS AS NEEDED FOR NAUSEA 20 tablet 0  . Pediatric Multiple Vitamins (FLINTSTONES MULTIVITAMIN) CHEW Chew 1 tablet by mouth daily. am    . simvastatin  (ZOCOR ) 40 mg tablet Take one tablet (40 mg dose) by mouth at bedtime. 90 tablet 3  . vitamin C (ASCORBIC ACID) 500 mg tablet Take one tablet (500 mg dose) by mouth daily.      Patient Care Team: Alm FORBES Bilis, MD as PCP - General (Family Medicine) Alm FORBES Bilis, MD as PCP - Family Medicine (Family Medicine) Leita KATHEE Hoyer, MD (Obstetrics and Gynecology) Social History   Tobacco Use  . Smoking status: Never    Passive exposure: Past  . Smokeless tobacco: Never  Substance Use Topics  . Alcohol use: No    Reviewed and updated this visit by provider: Tobacco  Allergies  Meds  Problems  Med Hx  Surg Hx  Fam Hx       Review of Systems is complete and negative except as noted.  Objective   Vitals:   08/03/23 0921  BP: 122/74  Pulse: 67  Temp: 97 F (36.1 C)  TempSrc: Temporal  Resp: 16  Height: 5' 6 (1.676 m)  Weight: 179 lb (81.2 kg)  SpO2: 99%  BMI (Calculated): 28.9   Wt Readings from Last 3 Encounters:  08/03/23 179 lb (81.2 kg)  06/02/23 178 lb (80.7 kg)  03/10/23 177 lb (80.3 kg)    Physical Exam The tip of the tongue appears irritated. Thyroid is normal. Lungs are clear. There is no swelling in the knee.  Vital Signs Blood pressure is 122/84.   Constitutional: Well-developed and well-nourished.  Sitting comfortably conversing normally.  Eyes: Conjunctivae, lids, and EOM are normal. Pupils are equal, round, and reactive to light. Oropharynx-geographic tongue is present.  Irritated tip of tongue.  Poor dentition.  Gums appear normal.  No other oral lesions are present. Lymphatics: There is no anterior or posterior cervical adenopathy. Neck: Supple with normal range of motion. No thyroid mass and no thyromegaly present. Cardiovascular: Regular rate and rhythm with normal heart  sounds and no edema present. There are no carotid bruits. Respiratory: Normal effort and clear to auscultation without wheezing or prolonged expiration. Skin: Skin is warm and dry. No rashes or bruising noted.  Psychiatric: Behavior is Cooperative and Polite. Mood euthymyic. Affect is appropriate. Skeletal-no swelling around the left knee.  Range of motion is full.    Patient's Medications       * Accurate as of August 03, 2023 10:00 AM. Reflects encounter med changes as of last refresh          New Prescriptions      Instructions  triamcinolone 0.1% paste Commonly known as: KENALOG Started by: Alm Bilis, MD  Mouth/Throat, 2 times a day       Continued Medications      Instructions  aspirin  325 mg  tablet  Take half tablet by mouth twice daily   diphenhydrAMINE 25 mg tablet Commonly known as: BANOPHEN  25 mg, Every 8 hours as needed   famotidine  20 mg tablet Commonly known as: PEPCID   TAKE 1 TABLET BY MOUTH TWICE DAILY AS NEEDED FOR  HEARTBURN   FLINTSTONES MULTIVITAMIN Chew  1 tablet, Daily   levothyroxine  sodium 50 mcg tablet Commonly known as: SYNTHROID ,LEVOTHROID,LOVOXYL  50 mcg, Oral, Daily   lisinopril 5 mg tablet Commonly known as: PRINIVIL,ZESTRIL  5 mg, Oral, Daily   metFORMIN 500 mg tablet Commonly known as: GLUCOPHAGE  500 mg, Oral, With breakfast   simvastatin  40 mg tablet Commonly known as: ZOCOR   40 mg, Oral, At bedtime   vitamin C 500 mg tablet Commonly known as: ASCORBIC ACID  500 mg, Daily       Discontinued Medications    ondansetron  4 mg disintegrating tablet Commonly known as: ZOFRAN -ODT Stopped by: Alm Bilis, MD        No orders of the defined types were placed in this encounter.   Computer technology was used to create visit note. Consent from the patient/caregiver was obtained prior to its use.  Alm FORBES Bilis, MD  *Some images could not be shown.

## 2023-08-15 ENCOUNTER — Other Ambulatory Visit: Payer: Self-pay | Admitting: Family Medicine

## 2023-08-15 DIAGNOSIS — Z1231 Encounter for screening mammogram for malignant neoplasm of breast: Secondary | ICD-10-CM

## 2023-09-08 NOTE — Progress Notes (Signed)
 09/08/2023  [facility]   Patient ID:  Sabrina Sawyer is a 83 y.o. (DOB [birth year]) female.  Assessment and Plan  Sabrina Sawyer was seen today for medicare wellness visit.  Diagnoses and all orders for this visit:  Medicare annual wellness visit, subsequent  Combined hyperlipidemia associated with type 2 diabetes mellitus (*) -     Lipid Panel; Future -     simvastatin  (ZOCOR ) 40 mg tablet; Take one tablet (40 mg dose) by mouth at bedtime. -     Lipid Panel  Acquired hypothyroidism -     TSH+Free T4; Future -     levothyroxine  sodium (SYNTHROID ,LEVOTHROID,LOVOXYL) 50 mcg tablet; Take one tablet (50 mcg dose) by mouth daily. -     TSH+Free T4  Controlled type 2 diabetes mellitus without complication, without long-term current use of insulin  (*) -     POCT A1C; Future -     CBC And Differential; Future -     Comprehensive Metabolic Panel; Future -     Vitamin B12; Future -     lisinopril (PRINIVIL,ZESTRIL) 5 mg tablet; Take one tablet (5 mg dose) by mouth daily. -     metFORMIN (GLUCOPHAGE) 500 mg tablet; Take one tablet (500 mg dose) by mouth with breakfast. -     Cancel: Albumin/Creatinine Ratio, Random Urine; Future -     Vitamin B12 -     Comprehensive Metabolic Panel -     CBC And Differential -     Cancel: Albumin/Creatinine Ratio, Random Urine -     POCT A1C -     POCT ACR URINE; Future  Unspecified vitamin D deficiency -     Vitamin D 25 Hydroxy; Future -     Vitamin D 25 Hydroxy   Assessment & Plan 1. Health maintenance. - Blood pressure readings are within the normal range, and weight remains stable. - Comprehensive panel of blood tests will be ordered, including TSH and T4. - Microalbumin test will be conducted today. - Scheduled for an eye exam and mammogram next month.  2. Gastroesophageal Reflux Disease (GERD). - Currently taking Pepcid  as needed for heartburn. - Adjusted diet to include less fat and greasy foods, which has  helped alleviate symptoms. - Reports stomach discomfort and occasional use of milk of magnesia. - Continue dietary modifications and Pepcid  as needed.  3. Hypertension. - Blood pressure is well-controlled on lisinopril 5 mg. - Refill for lisinopril 5 mg will be provided. - Blood pressure remains stable with current medication. - Prescription Drug Monitoring Program was reviewed.  4. Diabetes Mellitus. - Last A1c was 6.9, recorded 6 months ago. - Refill for metformin will be provided. - Microalbumin test will be conducted today. - Continue current diabetes management and monitor A1c levels.  5. Hyperlipidemia. - Refill for simvastatin  will be provided. - Cholesterol levels are being managed with simvastatin . - Continue current lipid-lowering therapy. - Prescription Drug Monitoring Program was reviewed.  6. Hypothyroidism. - Refill for thyroid medication will be provided. - Thyroid function tests (TSH, T4) will be ordered. - Continue current thyroid management. - Prescription Drug Monitoring Program was reviewed.  Follow-up The patient will follow up in 6 months.  Follow up in about 6 months (around 03/10/2024) for a Diabetes recheck.   Health Maintenance Due  Topic Date Due  . RSV Adult and Pregnancy (1 - 1-dose 75+ series) Never done  . DTaP/Tdap/Td Vaccines (2 - Td or Tdap) 09/15/2021  . DEXA Scan  07/23/2022  .  Diabetes GFR/EGFR  06/10/2023  . Diabetes Annual Microalbumin/Creatinine Ratio  06/10/2023  . Diabetes Foot Exam  06/10/2023  . HgbA1C  06/10/2023  . Diabetes Hemoglobin A1C  09/07/2023  . Diabetes Lipid Profile  09/07/2023  . TSH Level  09/07/2023  . COVID-19 Vaccine (8 - 2024-25 season) 09/07/2023  . Vitamin B12  09/07/2023  . Mammogram  10/15/2023     Risks, benefits, and alternatives of the medications and treatment plan prescribed today were discussed, and patient expressed understanding. Plan follow-up as discussed or as needed if any worsening symptoms  or change in condition.    A yearly preventative health exam was recommended and current age based recommendations were discussed.   Subjective   Patient ID:  Sabrina Sawyer is a 83 y.o. (DOB June 23, 1940) female    Patient presents with  . Medicare Wellness Visit    Type II DM: Patient here for follow-up of Type 2 diabetes mellitus.  Any episodes of hypoglycemia? no Eye exam current (within one year): yes Wt Readings from Last 3 Encounters:  09/08/23 176 lb (79.8 kg)  09/01/23 177 lb (80.3 kg)  08/03/23 179 lb (81.2 kg)    Lab Results  Component Value Date   Hemoglobin A1c 6.9 (A) 03/10/2023    The CVD Risk score (D'Agostino, et al., 2008) failed to calculate for the following reasons:   The 2008 CVD risk score is only valid for ages 39 to 59  Diabetic Medications     Biguanides     * metFORMIN (GLUCOPHAGE) 500 mg tablet Take one tablet (500 mg dose) by mouth with breakfast.       Cholesterol:  Patient here for follow up on cholesterol management. Currently on a statin: yes  Patient is compliant with treatment. Patient does not complain of new arthralgias or myalgias with the medication.  Lab Results  Component Value Date   LDL 98 09/07/2022    History of Present Illness The patient presents for a routine checkup.  She reports no memory issues and manages her financial responsibilities independently. She is scheduled for an eye examination next month, following cataract surgery performed last year in May. A mammogram is also planned for the upcoming month. She experiences depressive symptoms when her taxes are due but otherwise reports feeling well.  She continues to take Pepcid  as needed for heartburn and has incorporated milk of magnesia into her daily regimen, which she reports as beneficial. Dietary modifications have been made to include less fat and greasy food, which she believes has improved her symptoms.  She is on lisinopril 5 mg for blood  pressure management, which is reported to be effective.  She is on metformin once daily for diabetes management.  She is on simvastatin  once daily for cholesterol management.  PAST SURGICAL HISTORY: Cataract surgery in both eyes performed in 09/2022.   Current Outpatient Medications  Medication Instructions  . aspirin  325 mg tablet Take half tablet by mouth twice daily  . diphenhydrAMINE (BANOPHEN) 25 mg, Every 8 hours as needed  . famotidine  (PEPCID ) 20 mg tablet TAKE 1 TABLET BY MOUTH TWICE DAILY AS NEEDED FOR  HEARTBURN  . levothyroxine  sodium (SYNTHROID ,LEVOTHROID,LOVOXYL) 50 mcg, Oral, Daily  . lisinopril (PRINIVIL,ZESTRIL) 5 mg, Oral, Daily  . metFORMIN (GLUCOPHAGE) 500 mg, Oral, With breakfast  . ondansetron  (ZOFRAN -ODT) 4 mg disintegrating tablet DISSOLVE 1 TABLET IN MOUTH EVERY 12 HOURS AS NEEDED FOR NAUSEA  . Pediatric Multiple Vitamins (FLINTSTONES MULTIVITAMIN) CHEW 1 tablet, Daily  .  simvastatin  (ZOCOR ) 40 mg, Oral, At bedtime  . triamcinolone (KENALOG) 0.1% paste Mouth/Throat, 2 times a day  . vitamin C (ASCORBIC ACID) 500 mg, Daily   Patient Care Team: Alm FORBES Bilis, MD as PCP - General (Family Medicine) Alm FORBES Bilis, MD as PCP - Family Medicine (Family Medicine) Leita KATHEE Hoyer, MD (Obstetrics and Gynecology) Social History   Tobacco Use  . Smoking status: Never    Passive exposure: Past  . Smokeless tobacco: Never  Substance Use Topics  . Alcohol use: No    Reviewed and updated this visit by provider: Tobacco  Allergies  Meds  Problems  Med Hx  Surg Hx  Fam Hx       Review of Systems is complete and negative except as noted.  Objective   Vitals:   09/08/23 0942  BP: 122/74  Pulse: 76  Temp: 97.6 F (36.4 C)  TempSrc: Temporal  Resp: 16  Height: 5' 6 (1.676 m)  Weight: 176 lb (79.8 kg)  SpO2: 98%  BMI (Calculated): 28.4   Wt Readings from Last 3 Encounters:  09/08/23 176 lb (79.8 kg)  09/01/23 177 lb (80.3 kg)  08/03/23 179 lb  (81.2 kg)      Constitutional: Well-developed and well-nourished.  Sitting comfortably conversing normally.  Eyes: Conjunctivae, lids, and EOM are normal. Pupils are equal, round, and reactive to light. Lymphatics: There is no anterior or posterior cervical adenopathy. Neck: Supple with normal range of motion. No thyroid mass and no thyromegaly present. Cardiovascular: Regular rate and rhythm with normal heart sounds and no edema present. There are no carotid bruits. Respiratory: Normal effort and clear to auscultation without wheezing or prolonged expiration. Gastrointestinal: Soft and nontender to palpation. Bowel sounds are normal. There is no hepatosplenomegaly. No umbilical hernia.  Musculoskeletal: Gait is normal. Upper and Lower extremities are symmetrical with grossly normal muscle strength and tone with normal range of motion.  Skin: Skin is warm and dry. No rashes or bruising noted.  Psychiatric: Behavior is Cooperative and Polite. Mood euthymyic. Affect is appropriate. Diabetic foot exam:  Left: Monofilament test: Sensation normal  Pulses: normal and present  Skin: Normal and no erythema, no cyanosis or pallor   Other findings: none Right: Monofilament test: Sensation normal  Pulses: normal and present  Skin: Normal and no erythema, no cyanosis or pallor   Other findings: none Exam performed with shoes and socks removed.   Computer technology was used to create visit note. Consent from the patient/caregiver was obtained prior to its use.  Alm FORBES Bilis, MD     Medicare AWV  Djuana Okey Jackquline - Ghuman is a 83 y.o. female who presents for her subsequent annual wellness visit for Medicare.  Clinical documentation was reviewed and is accessible via encounter-level attachments.    Any physical exam components or additional concerns beyond the scope of the Annual Wellness Visit may be documented in a separate note within this encounter.  Medicare Required Components      Reviewed and updated this visit by provider: Tobacco  Allergies  Meds  Problems  Med Hx  Surg Hx  Fam Hx       Medicare required assessment: Future risk of substance use disorder / overdose risk of 5% is typical (1.3-20%).    Patient Care Team: Alm FORBES Bilis, MD as PCP - General (Family Medicine) Alm FORBES Bilis, MD as PCP - Family Medicine (Family Medicine) Leita KATHEE Hoyer, MD (Obstetrics and Gynecology)  Vitals   Vitals:  09/08/23 0942  BP: 122/74  Pulse: 76  Temp: 97.6 F (36.4 C)  TempSrc: Temporal  Resp: 16  Height: 5' 6 (1.676 m)  Weight: 176 lb (79.8 kg)  SpO2: 98%  BMI (Calculated): 28.4    Disposition   1. Medicare annual wellness visit, subsequent (Primary) 2. Combined hyperlipidemia associated with type 2 diabetes mellitus (*) -     Lipid Panel; Future; Expected date: 09/11/2023 -     simvastatin  (ZOCOR ) 40 mg tablet; Take one tablet (40 mg dose) by mouth at bedtime., Starting Thu 09/08/2023, Normal 3. Acquired hypothyroidism -     TSH+Free T4; Future; Expected date: 09/11/2023 -     levothyroxine  sodium (SYNTHROID ,LEVOTHROID,LOVOXYL) 50 mcg tablet; Take one tablet (50 mcg dose) by mouth daily., Starting Thu 09/08/2023, Normal 4. Controlled type 2 diabetes mellitus without complication, without long-term current use of insulin  (*) -     POCT A1C; Future; Expected date: 09/11/2023 -     CBC And Differential; Future -     Comprehensive Metabolic Panel; Future; Expected date: 09/11/2023 -     Vitamin B12; Future; Expected date: 09/11/2023 -     lisinopril (PRINIVIL,ZESTRIL) 5 mg tablet; Take one tablet (5 mg dose) by mouth daily., Starting Thu 09/08/2023, Normal -     metFORMIN (GLUCOPHAGE) 500 mg tablet; Take one tablet (500 mg dose) by mouth with breakfast., Starting Thu 09/08/2023, Normal -     POCT ACR URINE; Future; Expected date: 04/25/2024 5. Unspecified vitamin D deficiency -     Vitamin D 25 Hydroxy; Future; Expected date: 09/11/2023   Follow up  in about 6 months (around 03/10/2024) for a Diabetes recheck.   Health maintenance issues were discussed with the patient.  A written plan was provided to the patient in the form of patient instructions in the After Visit Summary document.     *Some images could not be shown.

## 2023-10-17 ENCOUNTER — Ambulatory Visit
Admission: RE | Admit: 2023-10-17 | Discharge: 2023-10-17 | Disposition: A | Discharge status: Home / Self Care | Source: Ambulatory Visit | Attending: Family Medicine | Admitting: Family Medicine

## 2023-10-17 DIAGNOSIS — Z1231 Encounter for screening mammogram for malignant neoplasm of breast: Secondary | ICD-10-CM

## 2023-12-09 ENCOUNTER — Emergency Department (HOSPITAL_COMMUNITY)

## 2023-12-09 ENCOUNTER — Other Ambulatory Visit: Payer: Self-pay

## 2023-12-09 ENCOUNTER — Encounter (HOSPITAL_COMMUNITY): Payer: Self-pay

## 2023-12-09 ENCOUNTER — Inpatient Hospital Stay (HOSPITAL_COMMUNITY)
Admission: EM | Admit: 2023-12-09 | Discharge: 2023-12-11 | DRG: 566 | Disposition: A | Discharge status: Home / Self Care | Attending: General Surgery | Admitting: General Surgery

## 2023-12-09 DIAGNOSIS — Z888 Allergy status to other drugs, medicaments and biological substances status: Secondary | ICD-10-CM | POA: Diagnosis not present

## 2023-12-09 DIAGNOSIS — Z23 Encounter for immunization: Secondary | ICD-10-CM

## 2023-12-09 DIAGNOSIS — E039 Hypothyroidism, unspecified: Secondary | ICD-10-CM | POA: Diagnosis present

## 2023-12-09 DIAGNOSIS — E119 Type 2 diabetes mellitus without complications: Secondary | ICD-10-CM | POA: Diagnosis present

## 2023-12-09 DIAGNOSIS — I10 Essential (primary) hypertension: Secondary | ICD-10-CM | POA: Diagnosis present

## 2023-12-09 DIAGNOSIS — Z923 Personal history of irradiation: Secondary | ICD-10-CM | POA: Diagnosis not present

## 2023-12-09 DIAGNOSIS — E785 Hyperlipidemia, unspecified: Secondary | ICD-10-CM | POA: Diagnosis present

## 2023-12-09 DIAGNOSIS — S299XXA Unspecified injury of thorax, initial encounter: Secondary | ICD-10-CM

## 2023-12-09 DIAGNOSIS — S2220XA Unspecified fracture of sternum, initial encounter for closed fracture: Secondary | ICD-10-CM | POA: Diagnosis present

## 2023-12-09 DIAGNOSIS — Y92410 Unspecified street and highway as the place of occurrence of the external cause: Secondary | ICD-10-CM

## 2023-12-09 DIAGNOSIS — S0031XA Abrasion of nose, initial encounter: Secondary | ICD-10-CM | POA: Diagnosis present

## 2023-12-09 DIAGNOSIS — T148XXA Other injury of unspecified body region, initial encounter: Secondary | ICD-10-CM

## 2023-12-09 LAB — CBC
HCT: 36.7 % (ref 36.0–46.0)
Hemoglobin: 11.7 g/dL — ABNORMAL LOW (ref 12.0–15.0)
MCH: 27.1 pg (ref 26.0–34.0)
MCHC: 31.9 g/dL (ref 30.0–36.0)
MCV: 85 fL (ref 80.0–100.0)
Platelets: 303 K/uL (ref 150–400)
RBC: 4.32 MIL/uL (ref 3.87–5.11)
RDW: 16.3 % — ABNORMAL HIGH (ref 11.5–15.5)
WBC: 7.6 K/uL (ref 4.0–10.5)
nRBC: 0 % (ref 0.0–0.2)

## 2023-12-09 LAB — COMPREHENSIVE METABOLIC PANEL WITH GFR
ALT: 21 U/L (ref 0–44)
AST: 31 U/L (ref 15–41)
Albumin: 3.5 g/dL (ref 3.5–5.0)
Alkaline Phosphatase: 101 U/L (ref 38–126)
Anion gap: 11 (ref 5–15)
BUN: 21 mg/dL (ref 8–23)
CO2: 28 mmol/L (ref 22–32)
Calcium: 9.7 mg/dL (ref 8.9–10.3)
Chloride: 101 mmol/L (ref 98–111)
Creatinine, Ser: 1.07 mg/dL — ABNORMAL HIGH (ref 0.44–1.00)
GFR, Estimated: 51 mL/min — ABNORMAL LOW (ref >60)
Glucose, Bld: 109 mg/dL — ABNORMAL HIGH (ref 70–99)
Potassium: 4.2 mmol/L (ref 3.5–5.1)
Sodium: 140 mmol/L (ref 135–145)
Total Bilirubin: 0.5 mg/dL (ref 0.0–1.2)
Total Protein: 6.9 g/dL (ref 6.5–8.1)

## 2023-12-09 LAB — I-STAT CHEM 8, ED
BUN: 21 mg/dL (ref 8–23)
Calcium, Ion: 1.01 mmol/L — ABNORMAL LOW (ref 1.15–1.40)
Chloride: 103 mmol/L (ref 98–111)
Creatinine, Ser: 1.1 mg/dL — ABNORMAL HIGH (ref 0.44–1.00)
Glucose, Bld: 107 mg/dL — ABNORMAL HIGH (ref 70–99)
HCT: 38 % (ref 36.0–46.0)
Hemoglobin: 12.9 g/dL (ref 12.0–15.0)
Potassium: 4.2 mmol/L (ref 3.5–5.1)
Sodium: 139 mmol/L (ref 135–145)
TCO2: 27 mmol/L (ref 22–32)

## 2023-12-09 LAB — CBC WITH DIFFERENTIAL/PLATELET
Abs Immature Granulocytes: 0.09 K/uL — ABNORMAL HIGH (ref 0.00–0.07)
Basophils Absolute: 0 K/uL (ref 0.0–0.1)
Basophils Relative: 0 %
Eosinophils Absolute: 0.1 K/uL (ref 0.0–0.5)
Eosinophils Relative: 1 %
HCT: 38.6 % (ref 36.0–46.0)
Hemoglobin: 11.9 g/dL — ABNORMAL LOW (ref 12.0–15.0)
Immature Granulocytes: 1 %
Lymphocytes Relative: 28 %
Lymphs Abs: 1.9 K/uL (ref 0.7–4.0)
MCH: 26.9 pg (ref 26.0–34.0)
MCHC: 30.8 g/dL (ref 30.0–36.0)
MCV: 87.3 fL (ref 80.0–100.0)
Monocytes Absolute: 0.5 K/uL (ref 0.1–1.0)
Monocytes Relative: 7 %
Neutro Abs: 4.3 K/uL (ref 1.7–7.7)
Neutrophils Relative %: 63 %
Platelets: 312 K/uL (ref 150–400)
RBC: 4.42 MIL/uL (ref 3.87–5.11)
RDW: 16.5 % — ABNORMAL HIGH (ref 11.5–15.5)
WBC: 6.8 K/uL (ref 4.0–10.5)
nRBC: 0 % (ref 0.0–0.2)

## 2023-12-09 LAB — SAMPLE TO BLOOD BANK

## 2023-12-09 LAB — LIPASE, BLOOD: Lipase: 27 U/L (ref 11–51)

## 2023-12-09 LAB — CREATININE, SERUM
Creatinine, Ser: 0.97 mg/dL (ref 0.44–1.00)
GFR, Estimated: 57 mL/min — ABNORMAL LOW (ref >60)

## 2023-12-09 LAB — TROPONIN I (HIGH SENSITIVITY)
Troponin I (High Sensitivity): 6 ng/L (ref <18)
Troponin I (High Sensitivity): 18 ng/L — ABNORMAL HIGH (ref <18)

## 2023-12-09 MED ORDER — ASPIRIN 81 MG PO CHEW
40.5000 mg | CHEWABLE_TABLET | Freq: Two times a day (BID) | ORAL | Status: DC
  Administered 2023-12-09 – 2023-12-11 (×4): 40.5 mg via ORAL
  Filled 2023-12-09 (×4): qty 1

## 2023-12-09 MED ORDER — OXYCODONE HCL 5 MG PO TABS
5.0000 mg | ORAL_TABLET | Freq: Once | ORAL | Status: AC
  Administered 2023-12-09: 5 mg via ORAL
  Filled 2023-12-09: qty 1

## 2023-12-09 MED ORDER — DOCUSATE SODIUM 100 MG PO CAPS
100.0000 mg | ORAL_CAPSULE | Freq: Two times a day (BID) | ORAL | Status: DC
  Administered 2023-12-09 – 2023-12-11 (×4): 100 mg via ORAL
  Filled 2023-12-09 (×4): qty 1

## 2023-12-09 MED ORDER — POLYETHYLENE GLYCOL 3350 17 G PO PACK: 17.0000 g | PACK | Freq: Every day | ORAL | Status: DC | PRN

## 2023-12-09 MED ORDER — FAMOTIDINE 20 MG PO TABS
20.0000 mg | ORAL_TABLET | Freq: Every day | ORAL | Status: DC | PRN
  Administered 2023-12-10: 20 mg via ORAL
  Filled 2023-12-09: qty 1

## 2023-12-09 MED ORDER — SIMVASTATIN 20 MG PO TABS
40.0000 mg | ORAL_TABLET | Freq: Every day | ORAL | Status: DC
  Administered 2023-12-10 – 2023-12-11 (×2): 40 mg via ORAL
  Filled 2023-12-09 (×2): qty 2

## 2023-12-09 MED ORDER — METOPROLOL TARTRATE 5 MG/5ML IV SOLN: 5.0000 mg | Freq: Four times a day (QID) | INTRAVENOUS | Status: DC | PRN

## 2023-12-09 MED ORDER — TRAMADOL HCL 50 MG PO TABS
25.0000 mg | ORAL_TABLET | Freq: Four times a day (QID) | ORAL | Status: DC | PRN
  Administered 2023-12-11: 25 mg via ORAL
  Filled 2023-12-09: qty 1

## 2023-12-09 MED ORDER — METHOCARBAMOL 500 MG PO TABS
500.0000 mg | ORAL_TABLET | Freq: Three times a day (TID) | ORAL | Status: DC
  Administered 2023-12-10 – 2023-12-11 (×4): 500 mg via ORAL
  Filled 2023-12-09 (×4): qty 1

## 2023-12-09 MED ORDER — FENTANYL CITRATE PF 50 MCG/ML IJ SOSY
25.0000 ug | PREFILLED_SYRINGE | Freq: Once | INTRAMUSCULAR | Status: AC
  Administered 2023-12-09: 25 ug via INTRAVENOUS
  Filled 2023-12-09: qty 1

## 2023-12-09 MED ORDER — TETANUS-DIPHTH-ACELL PERTUSSIS 5-2.5-18.5 LF-MCG/0.5 IM SUSY
0.5000 mL | PREFILLED_SYRINGE | Freq: Once | INTRAMUSCULAR | Status: AC
  Administered 2023-12-09: 0.5 mL via INTRAMUSCULAR
  Filled 2023-12-09: qty 0.5

## 2023-12-09 MED ORDER — HYDRALAZINE HCL 20 MG/ML IJ SOLN: 10.0000 mg | INTRAMUSCULAR | Status: DC | PRN

## 2023-12-09 MED ORDER — ONDANSETRON HCL 4 MG/2ML IJ SOLN
4.0000 mg | Freq: Four times a day (QID) | INTRAMUSCULAR | Status: DC | PRN
  Administered 2023-12-10: 4 mg via INTRAVENOUS
  Filled 2023-12-09: qty 2

## 2023-12-09 MED ORDER — ACETAMINOPHEN 500 MG PO TABS
1000.0000 mg | ORAL_TABLET | Freq: Four times a day (QID) | ORAL | Status: DC
  Administered 2023-12-09 – 2023-12-11 (×5): 1000 mg via ORAL
  Filled 2023-12-09 (×5): qty 2

## 2023-12-09 MED ORDER — IOHEXOL 350 MG/ML SOLN
75.0000 mL | Freq: Once | INTRAVENOUS | Status: AC | PRN
  Administered 2023-12-09: 75 mL via INTRAVENOUS

## 2023-12-09 MED ORDER — MORPHINE SULFATE (PF) 2 MG/ML IV SOLN: 2.0000 mg | INTRAVENOUS | Status: DC | PRN

## 2023-12-09 MED ORDER — INSULIN ASPART 100 UNIT/ML IJ SOLN
0.0000 [IU] | Freq: Three times a day (TID) | INTRAMUSCULAR | Status: DC
  Administered 2023-12-10: 2 [IU] via SUBCUTANEOUS
  Administered 2023-12-10: 3 [IU] via SUBCUTANEOUS

## 2023-12-09 MED ORDER — ACETAMINOPHEN 500 MG PO TABS
1000.0000 mg | ORAL_TABLET | ORAL | Status: AC
  Administered 2023-12-09: 1000 mg via ORAL
  Filled 2023-12-09: qty 2

## 2023-12-09 MED ORDER — LIDOCAINE 5 % EX PTCH
1.0000 | MEDICATED_PATCH | CUTANEOUS | Status: DC
  Administered 2023-12-09 – 2023-12-10 (×2): 1 via TRANSDERMAL
  Filled 2023-12-09 (×2): qty 1

## 2023-12-09 MED ORDER — LEVOTHYROXINE SODIUM 50 MCG PO TABS
50.0000 ug | ORAL_TABLET | Freq: Every day | ORAL | Status: DC
  Administered 2023-12-10 – 2023-12-11 (×2): 50 ug via ORAL
  Filled 2023-12-09 (×2): qty 1

## 2023-12-09 MED ORDER — ENOXAPARIN SODIUM 30 MG/0.3ML IJ SOSY
30.0000 mg | PREFILLED_SYRINGE | Freq: Two times a day (BID) | INTRAMUSCULAR | Status: DC
  Administered 2023-12-10 – 2023-12-11 (×3): 30 mg via SUBCUTANEOUS
  Filled 2023-12-09 (×3): qty 0.3

## 2023-12-09 MED ORDER — ONDANSETRON 4 MG PO TBDP: 4.0000 mg | ORAL_TABLET | Freq: Four times a day (QID) | ORAL | Status: DC | PRN

## 2023-12-09 MED ORDER — METHOCARBAMOL 1000 MG/10ML IJ SOLN
500.0000 mg | Freq: Three times a day (TID) | INTRAMUSCULAR | Status: DC
  Administered 2023-12-09: 500 mg via INTRAVENOUS
  Filled 2023-12-09: qty 10

## 2023-12-09 MED ORDER — INSULIN ASPART 100 UNIT/ML IJ SOLN: 0.0000 [IU] | Freq: Every day | INTRAMUSCULAR | Status: DC
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
## 2023-12-09 NOTE — ED Notes (Signed)
 XR at bedside
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
## 2023-12-09 NOTE — ED Notes (Signed)
 CCMD notified via telephone.
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
## 2023-12-10 LAB — CBC
HCT: 36.2 % (ref 36.0–46.0)
Hemoglobin: 11.2 g/dL — ABNORMAL LOW (ref 12.0–15.0)
MCH: 26.9 pg (ref 26.0–34.0)
MCHC: 30.9 g/dL (ref 30.0–36.0)
MCV: 86.8 fL (ref 80.0–100.0)
Platelets: 260 K/uL (ref 150–400)
RBC: 4.17 MIL/uL (ref 3.87–5.11)
RDW: 16.6 % — ABNORMAL HIGH (ref 11.5–15.5)
WBC: 5.2 K/uL (ref 4.0–10.5)
nRBC: 0 % (ref 0.0–0.2)

## 2023-12-10 LAB — BASIC METABOLIC PANEL WITH GFR
Anion gap: 10 (ref 5–15)
BUN: 14 mg/dL (ref 8–23)
CO2: 24 mmol/L (ref 22–32)
Calcium: 8.8 mg/dL — ABNORMAL LOW (ref 8.9–10.3)
Chloride: 103 mmol/L (ref 98–111)
Creatinine, Ser: 0.87 mg/dL (ref 0.44–1.00)
GFR, Estimated: >60 mL/min (ref >60)
Glucose, Bld: 128 mg/dL — ABNORMAL HIGH (ref 70–99)
Potassium: 4 mmol/L (ref 3.5–5.1)
Sodium: 137 mmol/L (ref 135–145)

## 2023-12-10 LAB — GLUCOSE, CAPILLARY
Glucose-Capillary: 131 mg/dL — ABNORMAL HIGH (ref 70–99)
Glucose-Capillary: 159 mg/dL — ABNORMAL HIGH (ref 70–99)
Glucose-Capillary: 118 mg/dL — ABNORMAL HIGH (ref 70–99)
Glucose-Capillary: 166 mg/dL — ABNORMAL HIGH (ref 70–99)

## 2023-12-10 MED ORDER — MORPHINE SULFATE (PF) 2 MG/ML IV SOLN: 2.0000 mg | Freq: Four times a day (QID) | INTRAVENOUS | Status: DC | PRN

## 2023-12-10 NOTE — TOC CAGE-AID Note (Signed)
 Transition of Care [facility]) - CAGE-AID Screening  Patient Details  Name: Sabrina Sawyer MRN: [id number] Date of Birth: 1940/05/22  Clinical Narrative:  Patient denies any alcohol or drug abuse, substance abuse resources not provided at this time.  CAGE-AID Screening:    Have You Ever Felt You Ought to Cut Down on Your Drinking or Drug Use?: No Have People Annoyed You By Critizing Your Drinking Or Drug Use?: No Have You Felt Bad Or Guilty About Your Drinking Or Drug Use?: No Have You Ever Had a Drink or Used Drugs First Thing In The Morning to Steady Your Nerves or to Get Rid of a Hangover?: No CAGE-AID Score: 0  Substance Abuse Education Offered: No
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
## 2023-12-11 LAB — GLUCOSE, CAPILLARY: Glucose-Capillary: 133 mg/dL — ABNORMAL HIGH (ref 70–99)

## 2023-12-11 MED ORDER — TRAMADOL HCL 50 MG PO TABS: 25.0000 mg | ORAL_TABLET | Freq: Four times a day (QID) | ORAL | 0 refills | Status: AC | PRN
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
## 2023-12-11 NOTE — Plan of Care (Signed)
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
## 2023-12-27 LAB — HEMOGLOBIN A1C

## 2023-12-29 ENCOUNTER — Emergency Department (HOSPITAL_COMMUNITY)

## 2023-12-29 ENCOUNTER — Other Ambulatory Visit: Payer: Self-pay

## 2023-12-29 ENCOUNTER — Emergency Department (HOSPITAL_COMMUNITY)
Admission: EM | Admit: 2023-12-29 | Discharge: 2023-12-29 | Disposition: A | Discharge status: Home / Self Care | Attending: Emergency Medicine | Admitting: Emergency Medicine

## 2023-12-29 DIAGNOSIS — R070 Pain in throat: Secondary | ICD-10-CM | POA: Diagnosis present

## 2023-12-29 DIAGNOSIS — E119 Type 2 diabetes mellitus without complications: Secondary | ICD-10-CM | POA: Insufficient documentation

## 2023-12-29 DIAGNOSIS — Z7982 Long term (current) use of aspirin: Secondary | ICD-10-CM | POA: Diagnosis not present

## 2023-12-29 DIAGNOSIS — I10 Essential (primary) hypertension: Secondary | ICD-10-CM | POA: Insufficient documentation

## 2023-12-29 DIAGNOSIS — Z79899 Other long term (current) drug therapy: Secondary | ICD-10-CM | POA: Diagnosis not present

## 2023-12-29 DIAGNOSIS — Z7984 Long term (current) use of oral hypoglycemic drugs: Secondary | ICD-10-CM | POA: Insufficient documentation

## 2023-12-29 LAB — GROUP A STREP BY PCR: Group A Strep by PCR: NOT DETECTED

## 2023-12-29 MED ORDER — DEXAMETHASONE 4 MG PO TABS
10.0000 mg | ORAL_TABLET | Freq: Once | ORAL | Status: AC
  Administered 2023-12-29: 10 mg via ORAL
  Filled 2023-12-29: qty 3

## 2023-12-29 NOTE — ED Notes (Signed)
 Patient transported to CT
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
## 2023-12-29 NOTE — Discharge Instructions (Addendum)
 Your CT scan did not show any sign of any foreign body in your throat.  However, if you have persistent problems, please follow-up with the ENT physician.
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
## 2024-04-03 ENCOUNTER — Emergency Department (HOSPITAL_COMMUNITY)
Admission: EM | Admit: 2024-04-03 | Discharge: 2024-04-03 | Disposition: A | Discharge status: Home / Self Care | Attending: Emergency Medicine | Admitting: Emergency Medicine

## 2024-04-03 ENCOUNTER — Encounter (HOSPITAL_COMMUNITY): Payer: Self-pay | Admitting: Emergency Medicine

## 2024-04-03 DIAGNOSIS — W25XXXA Contact with sharp glass, initial encounter: Secondary | ICD-10-CM | POA: Insufficient documentation

## 2024-04-03 DIAGNOSIS — Z7982 Long term (current) use of aspirin: Secondary | ICD-10-CM | POA: Insufficient documentation

## 2024-04-03 DIAGNOSIS — S0081XA Abrasion of other part of head, initial encounter: Secondary | ICD-10-CM | POA: Insufficient documentation

## 2024-04-03 DIAGNOSIS — R519 Headache, unspecified: Secondary | ICD-10-CM

## 2024-04-03 DIAGNOSIS — Z79899 Other long term (current) drug therapy: Secondary | ICD-10-CM | POA: Insufficient documentation

## 2024-04-03 DIAGNOSIS — S0990XA Unspecified injury of head, initial encounter: Secondary | ICD-10-CM | POA: Diagnosis present

## 2024-04-03 DIAGNOSIS — Z8542 Personal history of malignant neoplasm of other parts of uterus: Secondary | ICD-10-CM | POA: Insufficient documentation
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
## 2024-04-25 ENCOUNTER — Ambulatory Visit (INDEPENDENT_AMBULATORY_CARE_PROVIDER_SITE_OTHER)

## 2024-04-25 ENCOUNTER — Encounter (INDEPENDENT_AMBULATORY_CARE_PROVIDER_SITE_OTHER): Payer: Self-pay

## 2024-04-25 VITALS — BP 121/79 | HR 78 | Temp 98.0°F | Wt 175.0 lb

## 2024-04-25 DIAGNOSIS — H903 Sensorineural hearing loss, bilateral: Secondary | ICD-10-CM | POA: Diagnosis not present

## 2024-04-25 DIAGNOSIS — H5789 Other specified disorders of eye and adnexa: Secondary | ICD-10-CM

## 2024-04-25 DIAGNOSIS — H90A21 Sensorineural hearing loss, unilateral, right ear, with restricted hearing on the contralateral side: Secondary | ICD-10-CM

## 2024-04-25 DIAGNOSIS — H90A22 Sensorineural hearing loss, unilateral, left ear, with restricted hearing on the contralateral side: Secondary | ICD-10-CM

## 2024-04-25 DIAGNOSIS — H9190 Unspecified hearing loss, unspecified ear: Secondary | ICD-10-CM
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
## 2024-04-30 ENCOUNTER — Encounter (INDEPENDENT_AMBULATORY_CARE_PROVIDER_SITE_OTHER): Payer: Self-pay

## 2024-05-01 ENCOUNTER — Encounter (INDEPENDENT_AMBULATORY_CARE_PROVIDER_SITE_OTHER): Payer: Self-pay
